# Patient Record
Sex: Female | Born: 1990 | Race: White | Hispanic: No | Marital: Married | Smoking: Former smoker
Health system: Southern US, Community
[De-identification: ages and names within clinical notes are randomized; demographics above are authoritative.]

## PROBLEM LIST (undated history)

## (undated) ENCOUNTER — Inpatient Hospital Stay (HOSPITAL_COMMUNITY): Payer: Self-pay

## (undated) DIAGNOSIS — T8859XA Other complications of anesthesia, initial encounter: Secondary | ICD-10-CM

## (undated) HISTORY — DX: Other complications of anesthesia, initial encounter: T88.59XA

## (undated) HISTORY — PX: WISDOM TOOTH EXTRACTION: SHX21

---
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
## 2010-05-22 ENCOUNTER — Emergency Department (HOSPITAL_COMMUNITY): Admission: EM | Admit: 2010-05-22 | Discharge: 2010-05-23 | Payer: Self-pay | Admitting: Emergency Medicine

## 2011-06-15 ENCOUNTER — Emergency Department (HOSPITAL_COMMUNITY)
Admission: EM | Admit: 2011-06-15 | Discharge: 2011-06-15 | Disposition: A | Discharge status: Home / Self Care | Payer: Managed Care, Other (non HMO) | Attending: Emergency Medicine | Admitting: Emergency Medicine

## 2011-06-15 DIAGNOSIS — R5381 Other malaise: Secondary | ICD-10-CM | POA: Insufficient documentation

## 2011-06-15 DIAGNOSIS — K6289 Other specified diseases of anus and rectum: Secondary | ICD-10-CM | POA: Insufficient documentation

## 2011-06-15 LAB — CBC
HCT: 38.3 % (ref 36.0–46.0)
MCHC: 35.2 g/dL (ref 30.0–36.0)
Platelets: 199 10*3/uL (ref 150–400)
RDW: 12 % (ref 11.5–15.5)

## 2011-06-15 LAB — URINALYSIS, ROUTINE W REFLEX MICROSCOPIC
Nitrite: NEGATIVE
Specific Gravity, Urine: 1.025 (ref 1.005–1.030)
Urobilinogen, UA: 0.2 mg/dL (ref 0.0–1.0)
pH: 7 (ref 5.0–8.0)

## 2011-06-15 LAB — DIFFERENTIAL
Basophils Absolute: 0 10*3/uL (ref 0.0–0.1)
Basophils Relative: 0 % (ref 0–1)
Eosinophils Absolute: 0.3 10*3/uL (ref 0.0–0.7)
Eosinophils Relative: 3 % (ref 0–5)
Lymphocytes Relative: 18 % (ref 12–46)
Monocytes Absolute: 0.8 10*3/uL (ref 0.1–1.0)

## 2011-06-15 LAB — URINE MICROSCOPIC-ADD ON

## 2011-06-15 LAB — BASIC METABOLIC PANEL
BUN: 13 mg/dL (ref 6–23)
GFR calc Af Amer: >60 mL/min (ref >60)
GFR calc non Af Amer: >60 mL/min (ref >60)
Potassium: 4.1 mEq/L (ref 3.5–5.1)

## 2011-06-15 LAB — POCT PREGNANCY, URINE
Preg Test, Ur: NEGATIVE
Preg Test, Ur: NEGATIVE

## 2011-08-08 ENCOUNTER — Inpatient Hospital Stay (INDEPENDENT_AMBULATORY_CARE_PROVIDER_SITE_OTHER)
Admission: RE | Admit: 2011-08-08 | Discharge: 2011-08-08 | Disposition: A | Discharge status: Home / Self Care | Payer: Managed Care, Other (non HMO) | Source: Ambulatory Visit | Attending: Family Medicine | Admitting: Family Medicine

## 2011-08-08 DIAGNOSIS — M799 Soft tissue disorder, unspecified: Secondary | ICD-10-CM

## 2014-05-06 ENCOUNTER — Emergency Department (HOSPITAL_COMMUNITY)
Admission: EM | Admit: 2014-05-06 | Discharge: 2014-05-06 | Disposition: A | Discharge status: Home / Self Care | Payer: Managed Care, Other (non HMO) | Source: Home / Self Care | Attending: Family Medicine | Admitting: Family Medicine

## 2014-05-06 ENCOUNTER — Encounter (HOSPITAL_COMMUNITY): Payer: Self-pay | Admitting: Emergency Medicine

## 2014-05-06 DIAGNOSIS — M792 Neuralgia and neuritis, unspecified: Secondary | ICD-10-CM

## 2014-05-06 DIAGNOSIS — S20229A Contusion of unspecified back wall of thorax, initial encounter: Secondary | ICD-10-CM

## 2014-05-06 DIAGNOSIS — IMO0002 Reserved for concepts with insufficient information to code with codable children: Secondary | ICD-10-CM

## 2014-05-06 MED ORDER — GABAPENTIN 300 MG PO CAPS: 300.0000 mg | ORAL_CAPSULE | Freq: Two times a day (BID) | ORAL | Status: DC
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
## 2014-05-06 NOTE — Discharge Instructions (Signed)
Heat to back and medicine as needed, see your doctor or return if further problems.

## 2014-07-10 ENCOUNTER — Other Ambulatory Visit (HOSPITAL_COMMUNITY): Payer: Self-pay | Admitting: Obstetrics and Gynecology

## 2014-07-10 DIAGNOSIS — N979 Female infertility, unspecified: Secondary | ICD-10-CM

## 2014-08-07 ENCOUNTER — Ambulatory Visit (HOSPITAL_COMMUNITY)
Admission: RE | Admit: 2014-08-07 | Discharge: 2014-08-07 | Disposition: A | Discharge status: Home / Self Care | Payer: Managed Care, Other (non HMO) | Source: Ambulatory Visit | Attending: Obstetrics and Gynecology | Admitting: Obstetrics and Gynecology

## 2014-08-07 DIAGNOSIS — N979 Female infertility, unspecified: Secondary | ICD-10-CM | POA: Insufficient documentation

## 2014-08-07 MED ORDER — IOHEXOL 300 MG/ML  SOLN
20.0000 mL | Freq: Once | INTRAMUSCULAR | Status: AC | PRN
  Administered 2014-08-07: 7 mL

## 2014-12-04 NOTE — L&D Delivery Note (Signed)
Delivery Note At 7:01 PM a viable and healthy female was delivered via Vaginal, Spontaneous Delivery (Presentation: Left Occiput Anterior).  APGAR: 8, 9; weight  pending.   Placenta status: Intact, Spontaneous.  Cord: 3 vessels with the following complications: None  Anesthesia: Epidural  Episiotomy: None Lacerations: None Suture Repair: none Est. Blood Loss (mL): 300  Mom to postpartum.  Baby to Couplet care / Skin to Skin.  Bejamin Hackbart H. 09/04/2015, 7:28 PM

## 2014-12-21 ENCOUNTER — Encounter: Payer: Self-pay | Admitting: "Endocrinology

## 2014-12-21 ENCOUNTER — Ambulatory Visit (INDEPENDENT_AMBULATORY_CARE_PROVIDER_SITE_OTHER): Payer: Managed Care, Other (non HMO) | Admitting: "Endocrinology

## 2014-12-21 VITALS — BP 118/65 | HR 78 | Wt 224.0 lb

## 2014-12-21 DIAGNOSIS — R1013 Epigastric pain: Secondary | ICD-10-CM | POA: Insufficient documentation

## 2014-12-21 DIAGNOSIS — E049 Nontoxic goiter, unspecified: Secondary | ICD-10-CM

## 2014-12-21 DIAGNOSIS — E8881 Metabolic syndrome: Secondary | ICD-10-CM | POA: Insufficient documentation

## 2014-12-21 DIAGNOSIS — E88819 Insulin resistance, unspecified: Secondary | ICD-10-CM | POA: Insufficient documentation

## 2014-12-21 DIAGNOSIS — E161 Other hypoglycemia: Secondary | ICD-10-CM

## 2014-12-21 DIAGNOSIS — L83 Acanthosis nigricans: Secondary | ICD-10-CM

## 2014-12-21 DIAGNOSIS — E669 Obesity, unspecified: Secondary | ICD-10-CM

## 2014-12-21 DIAGNOSIS — N979 Female infertility, unspecified: Secondary | ICD-10-CM

## 2014-12-21 LAB — COMPREHENSIVE METABOLIC PANEL
ALT: 11 U/L (ref 0–35)
AST: 16 U/L (ref 0–37)
Albumin: 4 g/dL (ref 3.5–5.2)
Alkaline Phosphatase: 50 U/L (ref 39–117)
BUN: 9 mg/dL (ref 6–23)
CO2: 24 mEq/L (ref 19–32)
Calcium: 9.3 mg/dL (ref 8.4–10.5)
Chloride: 104 mEq/L (ref 96–112)
Creat: 0.63 mg/dL (ref 0.50–1.10)
Glucose, Bld: 82 mg/dL (ref 70–99)
Potassium: 3.9 mEq/L (ref 3.5–5.3)
Sodium: 136 mEq/L (ref 135–145)
Total Bilirubin: 0.3 mg/dL (ref 0.2–1.2)
Total Protein: 7.2 g/dL (ref 6.0–8.3)

## 2014-12-21 LAB — POCT GLYCOSYLATED HEMOGLOBIN (HGB A1C): Hemoglobin A1C: 5

## 2014-12-21 LAB — T3, FREE: T3 FREE: 3 pg/mL (ref 2.3–4.2)

## 2014-12-21 LAB — GLUCOSE, POCT (MANUAL RESULT ENTRY): POC Glucose: 99 mg/dl (ref 70–99)

## 2014-12-21 LAB — TSH: TSH: 1.256 u[IU]/mL (ref 0.350–4.500)

## 2014-12-21 LAB — T4, FREE: Free T4: 0.98 ng/dL (ref 0.80–1.80)

## 2014-12-21 MED ORDER — RANITIDINE HCL 150 MG PO TABS: 150.0000 mg | ORAL_TABLET | Freq: Two times a day (BID) | ORAL | Status: DC

## 2014-12-21 NOTE — Progress Notes (Signed)
Subjective:  Patient Name: Kathryn Carr Date of Birth: [birth year]  MRN: [id number]  Angila Wombles  presents to the office today after self-referral for initial endocrine consultation for the chief complaints of her obesity, infertility, and possible PCOS.  HISTORY OF PRESENT ILLNESS:   Rendi is a 24 y.o. Caucasian young woman.  Haruye was unaccompanied.  1. Present illness:  A. Chief complaint: Obesity   1). She has always been overweight, "chunky".   2). In her freshman year in high school she got tired of being bullied for being fat. She lost 50-60 pounds, from about 200 to 145. She exercised more, cut out sodas, and ate healthier. She was also bulimic for awhile. She kept the weight off for about 2 years. The she met her future husband,, ate out a lot with him, and both became obese.    3). Although she has tried to lose weight in the recent past, her husband wants her to eat more when he does. He says that she looks fine just as she is.    4). She has an Implanon implant from 2010-2012. She had the implant removed on November 2012. Despite having normal periods, and normal ovulation by blood tests and US imaging, the couple has been infertile.    5). Husband is 5-11 and weighs about 265 pounds. His energy level is much lower now than it was several years ago. He has high estradiol and low testosterone. He has some excess breast tissue. His sperm count is low and the sperm motility is low. The couple has intercourse every 1-7 days, usually about 2 times per week. Her libido usually exceeds his libido. She excuses his lack of libido as due to him working 6 days per week. He now takes letrozole.    6). She has been evaluated for infertility at a center in [place]. She was told that her female hormone levels were elevated at one time, but later told that the levels were normal. She is under treatment now. She took letrozole for 5 days from 12/03/14 to 12/07/14. She then had a trigger shot of Ovidrel  (?)(HCG?) on 12/15/14 to stimulate ovulation. She had intrauterine insemination with her husband's sperm on 12/17/14.   B. Pertinent past medical history:   1). Medical problems: Obesity   2). Surgeries: Wisdom teeth removed,    3). Allergies: Allergy to penicillin. Oxycodone causes nausea and vomiting. No allergies to environmental agents.   4). GYN: As above   5). Medications: None, except as above  C. Pertinent family history   1). Obesity: Mother, sister, 2 brothers, maternal grandparents, maternal uncle   2). Irregular periods/infertility: None   3). DM: Maternal grandfather had T2DM. Mother had GDM. Maternal uncle has T2DM.   4). Thyroid disease: Mom had Hodgkins Lymphoma involving her neck, so had XRT, leading to hypothyroidism. Paternal grandmother may have had thyroid problems.    5). ASCVD: Dad and paternal grandfather had MIs. PGF has also had several strokes.    6). Cancers: Mom had HL.    7). Others: Lots of arthritis, including both parents, MGF, MGM.  D. Lifestyle:   1). Diet: She loves carbs and salty things like pickles. Dinner is her big meal of the day. She drinks lots of sodas. She is not very interested, if interested at all, in changing her diet.     2). Physical activity: None. She is not interested in exercise.     2. Pertinent Review of Systems:  Constitutional: The patient  feels "fine". She has no significant complaints. Eyes: Vision is good with her glasses. One eye is "lazier" than the other. There are no other significant eye complaints. Neck: The patient has no complaints of anterior neck swelling, soreness, tenderness,  pressure, discomfort, or difficulty swallowing.  Heart: Heart rate increases with exercise or other physical activity. Heart rate also increases when she is anxious. The patient has no complaints of palpitations, irregular heat beats, chest pain, or chest pressure. Gastrointestinal: She has lots of belly hunger. "I'm always hungry.". Bowel movents  seem normal. The patient has no complaints of excessive hunger, acid reflux, upset stomach, stomach aches or pains, diarrhea, or constipation. Legs: Muscle mass and strength seem normal. There are no complaints of numbness, tingling, burning, or pain. No edema is noted. Feet: There are no obvious foot problems. There are no complaints of numbness, tingling, burning, or pain. No edema is noted. Her feet sometimes hurt when she stands and walks all day.  GYN: As above   PAST MEDICAL, FAMILY, AND SOCIAL HISTORY:  No past medical history on file.  No family history on file.   Current outpatient prescriptions:  .  gabapentin (NEURONTIN) 300 MG capsule, Take 1 capsule (300 mg total) by mouth 2 (two) times daily. (Patient not taking: Reported on 12/21/2014), Disp: 30 capsule, Rfl: 0  Allergies as of 12/21/2014 - Review Complete 12/21/2014  Allergen Reaction Noted  . Penicillins  05/06/2014    1. 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 2. Activities: Sedentary 3. Smoking, alcohol, or drugs: She smokes occasionally.  4. Primary Care Provider: None  REVIEW OF SYSTEMS: There are no other significant problems involving Shelsey's other body systems.   Objective:  Vital Signs:  BP 118/65 mmHg  Pulse 78  Wt 224 lb (101.606 kg)   Ht Readings from Last 3 Encounters:  No data found for Ht   Wt Readings from Last 3 Encounters:  12/21/14 224 lb (101.606 kg)   HC Readings from Last 3 Encounters:  No data found for [facility]   There is no height on file to calculate BSA.  Normalized stature-for-age data available only for age 9 to 7 years. Normalized weight-for-age data available only for age 9 to 20 years. Height is about 62 inches. She is 114 pounds above her Ideal Body Weight.  PHYSICAL EXAM:  Constitutional: The patient appears healthy and well nourished. She is alert and bright. She is not sure if she wants to make the effort to lose weight.  Face: The face  appears normal. She has some plethora, c/w her degree of obesity. There is no sexual hair on her face or sideburns area. Eyes: There is no obvious arcus or proptosis. Moisture appears normal. No hirsutism Mouth: The oropharynx and tongue appear normal. Oral moisture is normal. The oral mucosa is not hyperpigmented. Neck: The neck appears to be visibly normal. No carotid bruits are noted. The thyroid gland is slightly enlarged at 20-21 grams in size. The right lobe is normal in size. The left lobe is mildly enlarged. The consistency of the thyroid gland is normal. The thyroid gland is not tender to palpation. She has 1+ acanthosis. Chest: No sexual hair Lungs: The lungs are clear to auscultation. Air movement is good. Heart: Heart rate and rhythm are regular. Heart sounds S1 and S2 are normal. I did not appreciate any pathologic cardiac murmurs. Abdomen: The abdomen appears to be normal in size. Bowel sounds are normal.  There is no obvious hepatomegaly, splenomegaly, or other mass effect. There is no sexual hair.  Arms: Muscle size and bulk are normal for age. Hands: There is no obvious tremor. Phalangeal and metacarpophalangeal joints are normal. Palmar muscles are normal. Palmar skin is normal. Palmar moisture is also normal. There is not any palmar hyperpigmentation. Legs: Muscles appear normal for age. No edema is present. Neurologic: Strength is normal for age in both the upper and lower extremities. Muscle tone is normal. Sensation to touch is normal in both legs.   Skin: She has multiple pale striae of her abdominal skin.    LAB DATA:  Results for orders placed or performed in visit on 12/21/14 (from the past 504 hour(s))  POCT Glucose (CBG)   Collection Time: 12/21/14 11:03 AM  Result Value Ref Range   POC Glucose 99 70 - 99 mg/dl   Labs 12/21/14: HbA1c in our clinic today was 5.0%, which is mid-range normal for her age. TSH is 1.256, free T4 .98, free T3 3. TPO antibody level, CMP, and  C-peptide are pending.    Assessment and Plan:   ASSESSMENT:  1. Obesity: She is quite obese. Her overly fat adipose cells are producing cytokines that cause resistance to insulin. Her young beta cells produce excessive amounts of insulin resulting in hyperinsulinemia. The hyperinsulinemia, in turn, causes acanthosis and excess gastric acid production and increased belly hunger (dyspepsia).  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 Acanthosis: As above 5. Dyspepsia: As above 6. Goiter: She may have evolving Hashimoto's thyroiditis.She thinks that she has had thyroid tests done as part of her evaluation for infertility, but does not know the results.  7. Infertility: Based upon the information I have today, it sounds as if the major issue causing infertility is her husband's hypogonadism, low sperm count, and sperm immotility. I need to evaluate her records from [place] to determine if she has more evidence of infertility problems herself.  8. Possible PCOS:   A. By history she has had normal ovulatory periods. However, the use of letrozole and a triggering agent to induce ovulation indicates that she may have had some anovulatory periods. She does not have any clinical signs of hyperandrogenism. I do not know if she has actually had elevations of testosterone or adrenal androgens.   B. According to the [place] Criteria, to make the diagnosis of PCOS, one must have two of the three following characteristics:    1). PCO morphology on [place]   2). Clinical or chemical evidence of hyperandrogenism   3). Irregular periods, oligomenorrhea, or infertility.  C. According to the NIH Criteria used in the [place] to make the diagnosis of PCOS one must have both evidence of hyperandrogenism and irregular periods/infertility.  D. Until I can review the clinical reports and data from [place] I can't make any determination as to whether or not she has PCOS.   E. Without further data it is also  impossible to determine if she has The Metabolic Syndrome. While she has the central obesity, her BP and HbA1c are normal. We also need to determine her lipid levels.  PLAN: 1. Diagnostic: Obtain TFTs, TPO antibody, CMP, and C-peptide today. Await other lab results from [place].  2. Therapeutic: Ranitidine, 15 mg, twice daily to reduce dyspepsia. 3. Patient education: We discussed all of the above at great length. 4. Follow-up: 3 months. We will contact her with lab results.  Level of Service: This visit lasted in excess of 40  minutes. More than 50% of the visit was devoted to counseling.  Sherrlyn Hock, MD, CDE Adult and Pediatric Endocrinology

## 2014-12-21 NOTE — Patient Instructions (Signed)
Follow up visit in 3 months. Start ranitidine, 15 mg, twice daily at breakfast and dinner

## 2014-12-22 LAB — C-PEPTIDE: C-Peptide: 2.72 ng/mL (ref 0.80–3.90)

## 2014-12-22 LAB — THYROID PEROXIDASE ANTIBODY: Thyroperoxidase Ab SerPl-aCnc: 1 IU/mL (ref <9)

## 2014-12-23 ENCOUNTER — Telehealth: Payer: Self-pay | Admitting: "Endocrinology

## 2014-12-23 ENCOUNTER — Other Ambulatory Visit: Payer: Self-pay | Admitting: *Deleted

## 2014-12-23 DIAGNOSIS — R1013 Epigastric pain: Secondary | ICD-10-CM

## 2014-12-23 MED ORDER — RANITIDINE HCL 150 MG PO TABS: 150.0000 mg | ORAL_TABLET | Freq: Two times a day (BID) | ORAL | Status: DC

## 2014-12-23 NOTE — Telephone Encounter (Signed)
Script sent. KW

## 2015-01-19 ENCOUNTER — Encounter: Payer: Self-pay | Admitting: *Deleted

## 2015-01-27 LAB — US OB COMP LESS 14 WKS

## 2015-03-01 ENCOUNTER — Telehealth: Payer: Self-pay | Admitting: "Endocrinology

## 2015-03-01 NOTE — Telephone Encounter (Signed)
Forwarded to Dr. Brennan.  

## 2015-03-02 ENCOUNTER — Telehealth: Payer: Self-pay | Admitting: "Endocrinology

## 2015-03-02 NOTE — Telephone Encounter (Signed)
1.patient called and left message that she is pregnant and wants to discuss her thyroid tests.  2. When I tried to return her call, the mobile phone number we have was inactive.  3. I will ask our nurses to try to track her down tomorrow.  David StallBRENNAN,MICHAEL J

## 2015-03-04 ENCOUNTER — Other Ambulatory Visit (HOSPITAL_COMMUNITY): Payer: Self-pay | Admitting: Nurse Practitioner

## 2015-03-04 DIAGNOSIS — Z3682 Encounter for antenatal screening for nuchal translucency: Secondary | ICD-10-CM

## 2015-03-09 ENCOUNTER — Ambulatory Visit (HOSPITAL_COMMUNITY)
Admission: RE | Admit: 2015-03-09 | Discharge: 2015-03-09 | Disposition: A | Discharge status: Home / Self Care | Payer: Managed Care, Other (non HMO) | Source: Ambulatory Visit | Attending: Nurse Practitioner | Admitting: Nurse Practitioner

## 2015-03-09 ENCOUNTER — Encounter (HOSPITAL_COMMUNITY): Payer: Self-pay

## 2015-03-09 DIAGNOSIS — O99211 Obesity complicating pregnancy, first trimester: Secondary | ICD-10-CM | POA: Diagnosis not present

## 2015-03-09 DIAGNOSIS — Z36 Encounter for antenatal screening of mother: Secondary | ICD-10-CM | POA: Diagnosis not present

## 2015-03-09 DIAGNOSIS — Z3A13 13 weeks gestation of pregnancy: Secondary | ICD-10-CM | POA: Diagnosis not present

## 2015-03-09 DIAGNOSIS — Z3682 Encounter for antenatal screening for nuchal translucency: Secondary | ICD-10-CM | POA: Insufficient documentation

## 2015-03-16 ENCOUNTER — Other Ambulatory Visit (HOSPITAL_COMMUNITY): Payer: Self-pay | Admitting: "Endocrinology

## 2015-03-21 ENCOUNTER — Encounter (HOSPITAL_COMMUNITY): Payer: Self-pay | Admitting: Emergency Medicine

## 2015-03-21 ENCOUNTER — Emergency Department (HOSPITAL_COMMUNITY)
Admission: EM | Admit: 2015-03-21 | Discharge: 2015-03-22 | Disposition: A | Discharge status: Home / Self Care | Payer: Managed Care, Other (non HMO) | Attending: Emergency Medicine | Admitting: Emergency Medicine

## 2015-03-21 DIAGNOSIS — Z880 Allergy status to penicillin: Secondary | ICD-10-CM | POA: Insufficient documentation

## 2015-03-21 DIAGNOSIS — Z87891 Personal history of nicotine dependence: Secondary | ICD-10-CM | POA: Insufficient documentation

## 2015-03-21 DIAGNOSIS — X58XXXA Exposure to other specified factors, initial encounter: Secondary | ICD-10-CM | POA: Diagnosis not present

## 2015-03-21 DIAGNOSIS — Z3A15 15 weeks gestation of pregnancy: Secondary | ICD-10-CM | POA: Diagnosis not present

## 2015-03-21 DIAGNOSIS — O9A212 Injury, poisoning and certain other consequences of external causes complicating pregnancy, second trimester: Secondary | ICD-10-CM | POA: Insufficient documentation

## 2015-03-21 DIAGNOSIS — O9989 Other specified diseases and conditions complicating pregnancy, childbirth and the puerperium: Secondary | ICD-10-CM | POA: Diagnosis not present

## 2015-03-21 DIAGNOSIS — N898 Other specified noninflammatory disorders of vagina: Secondary | ICD-10-CM | POA: Insufficient documentation

## 2015-03-21 DIAGNOSIS — Y999 Unspecified external cause status: Secondary | ICD-10-CM | POA: Insufficient documentation

## 2015-03-21 DIAGNOSIS — S239XXA Sprain of unspecified parts of thorax, initial encounter: Secondary | ICD-10-CM

## 2015-03-21 DIAGNOSIS — R109 Unspecified abdominal pain: Secondary | ICD-10-CM | POA: Diagnosis not present

## 2015-03-21 DIAGNOSIS — R0789 Other chest pain: Secondary | ICD-10-CM | POA: Insufficient documentation

## 2015-03-21 DIAGNOSIS — Y929 Unspecified place or not applicable: Secondary | ICD-10-CM | POA: Insufficient documentation

## 2015-03-21 DIAGNOSIS — R0781 Pleurodynia: Secondary | ICD-10-CM

## 2015-03-21 DIAGNOSIS — Y939 Activity, unspecified: Secondary | ICD-10-CM | POA: Insufficient documentation

## 2015-03-21 LAB — URINALYSIS, ROUTINE W REFLEX MICROSCOPIC
BILIRUBIN URINE: NEGATIVE
Glucose, UA: NEGATIVE mg/dL
Hgb urine dipstick: NEGATIVE
Ketones, ur: NEGATIVE mg/dL
LEUKOCYTES UA: NEGATIVE
NITRITE: NEGATIVE
PH: 6.5 (ref 5.0–8.0)
Protein, ur: NEGATIVE mg/dL
Specific Gravity, Urine: 1.026 (ref 1.005–1.030)
Urobilinogen, UA: 0.2 mg/dL (ref 0.0–1.0)

## 2015-03-21 LAB — CBC WITH DIFFERENTIAL/PLATELET
BASOS PCT: 0 % (ref 0–1)
Basophils Absolute: 0 10*3/uL (ref 0.0–0.1)
EOS ABS: 0.2 10*3/uL (ref 0.0–0.7)
Eosinophils Relative: 2 % (ref 0–5)
HEMATOCRIT: 38.6 % (ref 36.0–46.0)
HEMOGLOBIN: 13 g/dL (ref 12.0–15.0)
Lymphocytes Relative: 20 % (ref 12–46)
Lymphs Abs: 2.1 10*3/uL (ref 0.7–4.0)
MCH: 29.3 pg (ref 26.0–34.0)
MCHC: 33.7 g/dL (ref 30.0–36.0)
MCV: 86.9 fL (ref 78.0–100.0)
MONOS PCT: 5 % (ref 3–12)
Monocytes Absolute: 0.5 10*3/uL (ref 0.1–1.0)
NEUTROS ABS: 7.4 10*3/uL (ref 1.7–7.7)
NEUTROS PCT: 73 % (ref 43–77)
Platelets: 214 10*3/uL (ref 150–400)
RBC: 4.44 MIL/uL (ref 3.87–5.11)
RDW: 13.3 % (ref 11.5–15.5)
WBC: 10.2 10*3/uL (ref 4.0–10.5)

## 2015-03-21 LAB — WET PREP, GENITAL
Trich, Wet Prep: NONE SEEN
WBC WET PREP: NONE SEEN
YEAST WET PREP: NONE SEEN

## 2015-03-21 NOTE — ED Notes (Signed)
C/o constant sharp pain to L side since 6pm.  Pt states she is [redacted] weeks pregnant.  Denies any other symptoms.

## 2015-03-21 NOTE — ED Provider Notes (Signed)
CSN: [id number]     Arrival date & time 03/21/15  2257 History   First MD Initiated Contact with Patient 03/21/15 2312     Chief Complaint  Patient presents with  . L side pain     (Consider location/radiation/quality/duration/timing/severity/associated sxs/prior Treatment) HPI Comments: Patient is a 24 year old female, currently [redacted] weeks pregnant, who presents to the emergency department for further evaluation of left flank pain. Patient states that pain has been sharp and constant since 1800 today. She states that she took to Tylenol without relief. She denies experiencing similar pain in the past. Patient is nonradiating and without modifying factors. She reports feeling the pain a bit more with deep breathing. She denies associated fever, leg swelling, hemoptysis, shortness of breath, syncope, new nausea or vomiting (she has had some mild N/V with pregnancy), vaginal bleeding, vaginal discharge, dysuria, or hematuria. No hx of abdominal surgeries. She reports a past U/S confirming intrauterine pregnancy.  The history is provided by the patient. No language interpreter was used.    History reviewed. No pertinent past medical history. Past Surgical History  Procedure Laterality Date  . Wisdom tooth extraction     No family history on file. History  Substance Use Topics  . Smoking status: Former Smoker    Quit date: 03/20/2014  . Smokeless tobacco: Never Used  . Alcohol Use: No   OB History    Gravida Para Term Preterm AB TAB SAB Ectopic Multiple Living   1               Review of Systems  Constitutional: Negative for fever.  Respiratory: Negative for shortness of breath.   Cardiovascular: Negative for chest pain and leg swelling.  Gastrointestinal: Negative for nausea, vomiting and abdominal pain.  Genitourinary: Positive for flank pain. Negative for vaginal bleeding and vaginal discharge.  Neurological: Negative for syncope.  All other systems reviewed and are  negative.   Allergies  Penicillins  Home Medications   Prior to Admission medications   Medication Sig Start Date End Date Taking? Authorizing Provider  Prenatal Vit-Fe Fumarate-FA (PRENATAL MULTIVITAMIN) TABS tablet Take 1 tablet by mouth daily at 12 noon.   Yes Historical Provider, MD  gabapentin (NEURONTIN) 300 MG capsule Take 1 capsule (300 mg total) by mouth 2 (two) times daily. Patient not taking: Reported on 12/21/2014 05/06/14   Linna Hoff, MD  ranitidine (ZANTAC) 150 MG tablet Take 1 tablet (150 mg total) by mouth 2 (two) times daily. Patient not taking: Reported on 03/21/2015 12/23/14   David Stall, MD   BP 113/67 mmHg  Pulse 87  Temp(Src) 98.3 F (36.8 C) (Oral)  Resp 16  Ht  (1.575 m)  Wt 237 lb (107.502 kg)  BMI 43.34 kg/m2  SpO2 98%  LMP 12/03/2014   Physical Exam  Constitutional: She is oriented to person, place, and time. She appears well-developed and well-nourished. No distress.  Nontoxic/nonseptic appearing. Patient pleasant.  HENT:  Head: Normocephalic and atraumatic.  Mouth/Throat: Oropharynx is clear and moist.  Eyes: Conjunctivae and EOM are normal. No scleral icterus.  Neck: Normal range of motion.  Cardiovascular: Normal rate, regular rhythm and intact distal pulses.   Pulmonary/Chest: Effort normal and breath sounds normal. No respiratory distress. She has no wheezes. She has no rales.  Chest expansion symmetric. Lungs clear. No tachypnea or dyspnea.  Abdominal: Soft. There is tenderness. There is no rebound and no guarding.  Abdomen soft with mild tenderness to the left mid abdomen. Patient also  with referred tenderness to the suprapubic region when palpating the left mid abdomen. No masses or peritoneal signs appreciated.  Genitourinary: There is no rash, tenderness, lesion or injury on the right labia. There is no rash, tenderness, lesion or injury on the left labia. Uterus is not tender. Cervix exhibits no motion tenderness and no  friability. Right adnexum displays no mass, no tenderness and no fullness. Left adnexum displays tenderness (mild). Left adnexum displays no mass and no fullness. No bleeding in the vagina. Vaginal discharge (clear/white discharge) found.  Musculoskeletal: Normal range of motion. She exhibits tenderness.  Mild left thoracic paraspinal muscle tenderness. No tenderness to the thoracic midline.  Neurological: She is alert and oriented to person, place, and time. She exhibits normal muscle tone. Coordination normal.  GCS 15. Speech is goal oriented. Patient moves extremities without ataxia.  Skin: Skin is warm and dry. No rash noted. She is not diaphoretic. No erythema. No pallor.  Psychiatric: She has a normal mood and affect. Her behavior is normal.  Nursing note and vitals reviewed.   ED Course  Procedures (including critical care time) Labs Review Labs Reviewed  WET PREP, GENITAL - Abnormal; Notable for the following:    Clue Cells Wet Prep HPF POC FEW (*)    All other components within normal limits  COMPREHENSIVE METABOLIC PANEL - Abnormal; Notable for the following:    Glucose, Bld 102 (*)    Albumin 3.2 (*)    All other components within normal limits  URINALYSIS, ROUTINE W REFLEX MICROSCOPIC - Abnormal; Notable for the following:    APPearance CLOUDY (*)    All other components within normal limits  HCG, QUANTITATIVE, PREGNANCY - Abnormal; Notable for the following:    hCG, Beta Chain, Quant, [place] [phone] (*)    All other components within normal limits  D-DIMER, QUANTITATIVE - Abnormal; Notable for the following:    D-Dimer, Quant 0.52 (*)    All other components within normal limits  CBC WITH DIFFERENTIAL/PLATELET  LIPASE, BLOOD  GC/CHLAMYDIA PROBE AMP ([place])    Imaging Review Ct Angio Chest Pe W/cm &/or Wo Cm  03/22/2015   CLINICAL DATA:  Pleuritic chest pain. Elevated D-dimer. Fifteen weeks pregnant.  EXAM: CT ANGIOGRAPHY CHEST WITH CONTRAST  TECHNIQUE: Multidetector  CT imaging of the chest was performed using the standard protocol during bolus administration of intravenous contrast. Multiplanar CT image reconstructions and MIPs were obtained to evaluate the vascular anatomy.  CONTRAST:  75mL OMNIPAQUE IOHEXOL 350 MG/ML SOLN  COMPARISON:  None.  FINDINGS: Pulmonary arterial opacification is adequate without evidence of emboli allowing for mild motion artifact. No enlarged axillary, mediastinal, or hilar lymph nodes are identified. A small amount of homogeneous, triangular-shaped soft tissue in the anterior mediastinum may represent a small amount of residual thymus. Heart is normal in size. There is no pleural or pericardial effusion. No lung consolidation or nodules are identified.  Visualized portion of the upper abdomen is unremarkable. Mild endplate irregularity of several adjacent lower thoracic vertebral bodies with associated, at most minimal anterior vertebral body wedging is chronic in appearance.  Review of the MIP images confirms the above findings.  IMPRESSION: No evidence of pulmonary emboli.   Electronically Signed   By: Sebastian AcheAllen  Grady   On: 03/22/2015 02:53     EKG Interpretation None      MDM   Final diagnoses:  Chest pain, pleuritic  Thoracic back sprain, initial encounter    24 year old female presents to the emergency department for  further evaluation of pain to her left mid back. She is [redacted] weeks pregnant with confirmed intrauterine pregnancy. Patient is afebrile and hemodynamically stable. Her laboratory workup is reassuring. Urinalysis does not suggest infection. Patient complaining of pain which is worse with inspiration. Given her pregnancy, pulmonary embolus considered. EKG is nonischemic with no signs of heart strain. Her d-dimer was 0.52. I discussed with the patient the risks and benefits of CT scan today. Patient chose to proceed with CT angio.  CT scan reviewed which shows no evidence of pulmonary embolus. Given reproducibility of  pain, suspect that symptoms are musculoskeletal in origin. Will manage as outpatient with Tylenol as well as ice and heat. Patient advised to follow-up with her OB/GYN and primary doctor for further evaluation of symptoms. Return precautions discussed and provided. Patient agreeable to plan with no unaddressed concerns. Patient discharged in good condition; VSS.   Filed Vitals:   03/22/15 0215 03/22/15 0230 03/22/15 0245 03/22/15 0300  BP: 123/59 121/59 121/57 113/67  Pulse: 75 72 76 87  Temp:      TempSrc:      Resp: Height:      Weight:      SpO2: 99% 99% 98% 98%     Antony Madura, PA-C 03/22/15 4098  Gilda Crease, MD 03/22/15 [phone]

## 2015-03-22 ENCOUNTER — Encounter (HOSPITAL_COMMUNITY): Payer: Self-pay | Admitting: *Deleted

## 2015-03-22 ENCOUNTER — Emergency Department (HOSPITAL_COMMUNITY): Payer: Managed Care, Other (non HMO)

## 2015-03-22 ENCOUNTER — Encounter (HOSPITAL_COMMUNITY): Payer: Self-pay | Admitting: Radiology

## 2015-03-22 ENCOUNTER — Inpatient Hospital Stay (HOSPITAL_COMMUNITY)
Admission: AD | Admit: 2015-03-22 | Discharge: 2015-03-22 | Disposition: A | Discharge status: Home / Self Care | Payer: Managed Care, Other (non HMO) | Source: Ambulatory Visit | Attending: Obstetrics and Gynecology | Admitting: Obstetrics and Gynecology

## 2015-03-22 DIAGNOSIS — Z3A15 15 weeks gestation of pregnancy: Secondary | ICD-10-CM | POA: Diagnosis not present

## 2015-03-22 DIAGNOSIS — O9989 Other specified diseases and conditions complicating pregnancy, childbirth and the puerperium: Secondary | ICD-10-CM | POA: Diagnosis not present

## 2015-03-22 DIAGNOSIS — Z87891 Personal history of nicotine dependence: Secondary | ICD-10-CM | POA: Insufficient documentation

## 2015-03-22 DIAGNOSIS — O9A212 Injury, poisoning and certain other consequences of external causes complicating pregnancy, second trimester: Secondary | ICD-10-CM | POA: Diagnosis not present

## 2015-03-22 DIAGNOSIS — R109 Unspecified abdominal pain: Secondary | ICD-10-CM | POA: Diagnosis not present

## 2015-03-22 DIAGNOSIS — R1032 Left lower quadrant pain: Secondary | ICD-10-CM | POA: Insufficient documentation

## 2015-03-22 LAB — COMPREHENSIVE METABOLIC PANEL
ALT: 18 U/L (ref 0–35)
AST: 20 U/L (ref 0–37)
Albumin: 3.2 g/dL — ABNORMAL LOW (ref 3.5–5.2)
Alkaline Phosphatase: 45 U/L (ref 39–117)
Anion gap: 11 (ref 5–15)
BILIRUBIN TOTAL: 0.4 mg/dL (ref 0.3–1.2)
BUN: 6 mg/dL (ref 6–23)
CHLORIDE: 104 mmol/L (ref 96–112)
CO2: 21 mmol/L (ref 19–32)
CREATININE: 0.72 mg/dL (ref 0.50–1.10)
Calcium: 8.9 mg/dL (ref 8.4–10.5)
GFR calc Af Amer: >90 mL/min (ref >90)
Glucose, Bld: 102 mg/dL — ABNORMAL HIGH (ref 70–99)
Potassium: 4 mmol/L (ref 3.5–5.1)
SODIUM: 136 mmol/L (ref 135–145)
Total Protein: 6.6 g/dL (ref 6.0–8.3)

## 2015-03-22 LAB — URINALYSIS, ROUTINE W REFLEX MICROSCOPIC
BILIRUBIN URINE: NEGATIVE
Glucose, UA: NEGATIVE mg/dL
Hgb urine dipstick: NEGATIVE
KETONES UR: NEGATIVE mg/dL
Leukocytes, UA: NEGATIVE
NITRITE: NEGATIVE
PH: 6 (ref 5.0–8.0)
PROTEIN: NEGATIVE mg/dL
Specific Gravity, Urine: 1.025 (ref 1.005–1.030)
Urobilinogen, UA: 0.2 mg/dL (ref 0.0–1.0)

## 2015-03-22 LAB — LIPASE, BLOOD: LIPASE: 29 U/L (ref 11–59)

## 2015-03-22 LAB — D-DIMER, QUANTITATIVE (NOT AT ARMC): D DIMER QUANT: 0.52 ug{FEU}/mL — AB (ref 0.00–0.48)

## 2015-03-22 LAB — GC/CHLAMYDIA PROBE AMP (~~LOC~~) NOT AT ARMC
Chlamydia: NEGATIVE
Neisseria Gonorrhea: NEGATIVE

## 2015-03-22 LAB — HCG, QUANTITATIVE, PREGNANCY: HCG, BETA CHAIN, QUANT, S: 41685 m[IU]/mL — AB (ref <5)

## 2015-03-22 MED ORDER — IOHEXOL 350 MG/ML SOLN
75.0000 mL | Freq: Once | INTRAVENOUS | Status: AC | PRN
Start: 2015-03-22 — End: 2015-03-22
  Administered 2015-03-22: 75 mL via INTRAVENOUS

## 2015-03-22 NOTE — MAU Note (Signed)
Seen @ [facility] last night for side pain, had CT scan, now has LLQ pain since she got home this morning.  Denies bleeding.  Concerned because she started having sx's after CT scan.

## 2015-03-22 NOTE — Discharge Instructions (Signed)
Recommend Tylenol as needed as well as alternating of ice and heat to area of pain. Follow-up with your OB/GYN for further evaluation of symptoms as needed. Return to the emergency department as needed if symptoms worsen.   Muscle Strain A muscle strain is an injury that occurs when a muscle is stretched beyond its normal length. Usually a small number of muscle fibers are torn when this happens. Muscle strain is rated in degrees. First-degree strains have the least amount of muscle fiber tearing and pain. Second-degree and third-degree strains have increasingly more tearing and pain.  Usually, recovery from muscle strain takes 1-2 weeks. Complete healing takes 5-6 weeks.  CAUSES  Muscle strain happens when a sudden, violent force placed on a muscle stretches it too far. This may occur with lifting, sports, or a fall.  RISK FACTORS Muscle strain is especially common in athletes.  SIGNS AND SYMPTOMS At the site of the muscle strain, there may be:  Pain.  Bruising.  Swelling.  Difficulty using the muscle due to pain or lack of normal function. DIAGNOSIS  Your health care provider will perform a physical exam and ask about your medical history. TREATMENT  Often, the best treatment for a muscle strain is resting, icing, and applying cold compresses to the injured area.  HOME CARE INSTRUCTIONS   Use the PRICE method of treatment to promote muscle healing during the first 2-3 days after your injury. The PRICE method involves:  Protecting the muscle from being injured again.  Restricting your activity and resting the injured body part.  Icing your injury. To do this, put ice in a plastic bag. Place a towel between your skin and the bag. Then, apply the ice and leave it on from 15-20 minutes each hour. After the third day, switch to moist heat packs.  Apply compression to the injured area with a splint or elastic bandage. Be careful not to wrap it too tightly. This may interfere with blood  circulation or increase swelling.  Elevate the injured body part above the level of your heart as often as you can.  Only take over-the-counter or prescription medicines for pain, discomfort, or fever as directed by your health care provider.  Warming up prior to exercise helps to prevent future muscle strains. SEEK MEDICAL CARE IF:   You have increasing pain or swelling in the injured area.  You have numbness, tingling, or a significant loss of strength in the injured area. MAKE SURE YOU:   Understand these instructions.  Will watch your condition.  Will get help right away if you are not doing well or get worse. Document Released: 11/20/2005 Document Revised: 09/10/2013 Document Reviewed: 06/19/2013 [facility] Patient Information 2015 [place]. This information is not intended to replace advice given to you by your health care provider. Make sure you discuss any questions you have with your health care provider.

## 2015-03-22 NOTE — ED Notes (Signed)
Contacted CT

## 2015-03-22 NOTE — ED Notes (Signed)
Pt stable, ambulatory, pain decreased to 1/10, pt states understanding of discharge instructions

## 2015-03-22 NOTE — MAU Provider Note (Signed)
  History   G1 at 15.4 wks in with c/o lower left quad pain x several days. Pain is sharp and intermittant in nature. Denies bleeding or ROM  CSN: [id number]  Arrival date and time: 03/22/15 1248   First Provider Initiated Contact with Patient 03/22/15 1433      Chief Complaint  Patient presents with  . Abdominal Pain   HPI  OB History    Gravida Para Term Preterm AB TAB SAB Ectopic Multiple Living   1               History reviewed. No pertinent past medical history.  Past Surgical History  Procedure Laterality Date  . Wisdom tooth extraction      History reviewed. No pertinent family history.  History  Substance Use Topics  . Smoking status: Former Smoker    Quit date: 03/20/2014  . Smokeless tobacco: Never Used  . Alcohol Use: No    Allergies:  Allergies  Allergen Reactions  . Penicillins Hives    Prescriptions prior to admission  Medication Sig Dispense Refill Last Dose  . gabapentin (NEURONTIN) 300 MG capsule Take 1 capsule (300 mg total) by mouth 2 (two) times daily. (Patient not taking: Reported on 12/21/2014) 30 capsule 0 Not Taking  . Prenatal Vit-Fe Fumarate-FA (PRENATAL MULTIVITAMIN) TABS tablet Take 1 tablet by mouth daily at 12 noon.   03/20/2015 at Unknown time  . ranitidine (ZANTAC) 150 MG tablet Take 1 tablet (150 mg total) by mouth 2 (two) times daily. (Patient not taking: Reported on 03/21/2015) 60 tablet 6     Review of Systems  Constitutional: Negative.   HENT: Negative.   Eyes: Negative.   Respiratory: Negative.   Cardiovascular: Negative.   Gastrointestinal: Positive for abdominal pain.  Genitourinary: Negative.   Musculoskeletal: Negative.   Skin: Negative.   Neurological: Negative.   Endo/Heme/Allergies: Negative.   Psychiatric/Behavioral: Negative.    Physical Exam   Blood pressure 124/63, pulse 78, temperature 98.2 F (36.8 C), temperature source Oral, resp. rate 20, last menstrual period 12/03/2014.  Physical Exam   Constitutional: She is oriented to person, place, and time. She appears well-developed and well-nourished.  HENT:  Head: Normocephalic.  Eyes: Pupils are equal, round, and reactive to light.  Neck: Normal range of motion.  Cardiovascular: Normal rate, regular rhythm, normal heart sounds and intact distal pulses.   Respiratory: Effort normal and breath sounds normal.  GI: Soft. Bowel sounds are normal.  Genitourinary: Vagina normal and uterus normal.  Musculoskeletal: Normal range of motion.  Neurological: She is alert and oriented to person, place, and time. She has normal reflexes.  Skin: Skin is warm and dry.  Psychiatric: She has a normal mood and affect. Her behavior is normal. Judgment and thought content normal.    MAU Course  Procedures  MDM Normal discomforts of pregnancy.   Assessment and Plan  SVE firm/cl/post/high. FHR 138 st and regular. Normal exam. D/c home.  Wyvonnia DuskyLAWSON, Lavoris Canizales DARLENE 03/22/2015, 2:44 PM

## 2015-03-24 ENCOUNTER — Ambulatory Visit: Payer: Managed Care, Other (non HMO) | Admitting: "Endocrinology

## 2015-03-24 ENCOUNTER — Other Ambulatory Visit: Payer: Self-pay

## 2015-03-25 LAB — CYTOLOGY - PAP

## 2015-04-03 LAB — OB RESULTS CONSOLE ABO/RH

## 2015-04-06 NOTE — Telephone Encounter (Signed)
Please call patient and discuss concerns. Please fax lab work to phone number below.

## 2015-04-07 NOTE — Telephone Encounter (Signed)
Handled by Fransico MichaelBrennan on 3/29

## 2015-05-27 ENCOUNTER — Other Ambulatory Visit (HOSPITAL_COMMUNITY): Payer: Self-pay | Admitting: Obstetrics and Gynecology

## 2015-05-27 DIAGNOSIS — O283 Abnormal ultrasonic finding on antenatal screening of mother: Secondary | ICD-10-CM

## 2015-06-01 ENCOUNTER — Ambulatory Visit: Payer: Managed Care, Other (non HMO) | Admitting: "Endocrinology

## 2015-06-08 ENCOUNTER — Ambulatory Visit (HOSPITAL_COMMUNITY): Admission: RE | Admit: 2015-06-08 | Payer: Managed Care, Other (non HMO) | Source: Ambulatory Visit

## 2015-06-08 ENCOUNTER — Encounter (HOSPITAL_COMMUNITY): Payer: Self-pay

## 2015-06-08 ENCOUNTER — Ambulatory Visit (HOSPITAL_COMMUNITY)
Admission: RE | Admit: 2015-06-08 | Discharge: 2015-06-08 | Disposition: A | Discharge status: Home / Self Care | Payer: Managed Care, Other (non HMO) | Source: Ambulatory Visit | Attending: Obstetrics and Gynecology | Admitting: Obstetrics and Gynecology

## 2015-06-08 DIAGNOSIS — O358XX0 Maternal care for other (suspected) fetal abnormality and damage, not applicable or unspecified: Secondary | ICD-10-CM | POA: Insufficient documentation

## 2015-06-08 DIAGNOSIS — O99212 Obesity complicating pregnancy, second trimester: Secondary | ICD-10-CM | POA: Insufficient documentation

## 2015-06-08 DIAGNOSIS — O283 Abnormal ultrasonic finding on antenatal screening of mother: Secondary | ICD-10-CM | POA: Diagnosis not present

## 2015-06-08 DIAGNOSIS — O09812 Supervision of pregnancy resulting from assisted reproductive technology, second trimester: Secondary | ICD-10-CM | POA: Insufficient documentation

## 2015-06-08 DIAGNOSIS — Z36 Encounter for antenatal screening of mother: Secondary | ICD-10-CM | POA: Insufficient documentation

## 2015-06-08 DIAGNOSIS — Z3A26 26 weeks gestation of pregnancy: Secondary | ICD-10-CM | POA: Diagnosis not present

## 2015-06-08 DIAGNOSIS — Z3689 Encounter for other specified antenatal screening: Secondary | ICD-10-CM | POA: Insufficient documentation

## 2015-06-10 ENCOUNTER — Ambulatory Visit (HOSPITAL_COMMUNITY): Admission: RE | Admit: 2015-06-10 | Payer: Managed Care, Other (non HMO) | Source: Ambulatory Visit

## 2015-06-10 ENCOUNTER — Encounter (HOSPITAL_COMMUNITY): Payer: Managed Care, Other (non HMO)

## 2015-06-11 ENCOUNTER — Encounter (HOSPITAL_COMMUNITY): Payer: Self-pay | Admitting: Obstetrics and Gynecology

## 2015-06-11 ENCOUNTER — Other Ambulatory Visit (HOSPITAL_COMMUNITY): Payer: Self-pay | Admitting: Obstetrics and Gynecology

## 2015-06-15 IMAGING — US US MFM FETAL NUCHAL TRANSLUCENCY
1 series · 13 of 28 positions shown · non-contrast
Comparison: none

[Series 1: us mfm fetal nuchal translucency · 0.12mm/px · 13 of 46 slices shown]
[im 2/46]
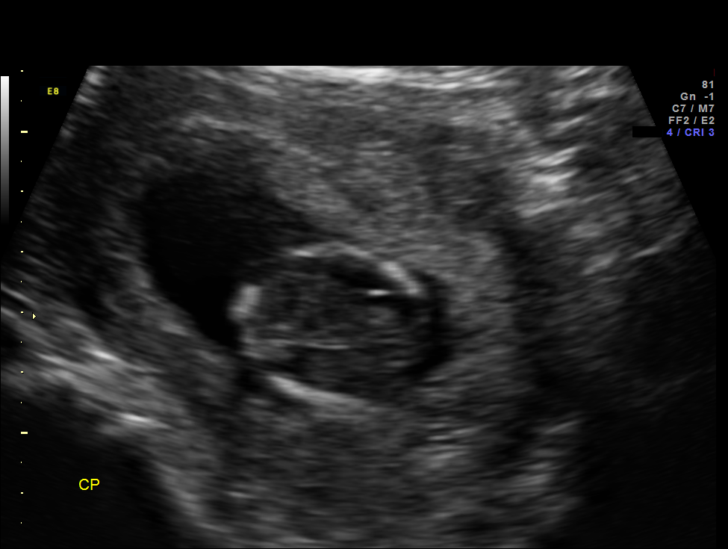
[im 6/46]
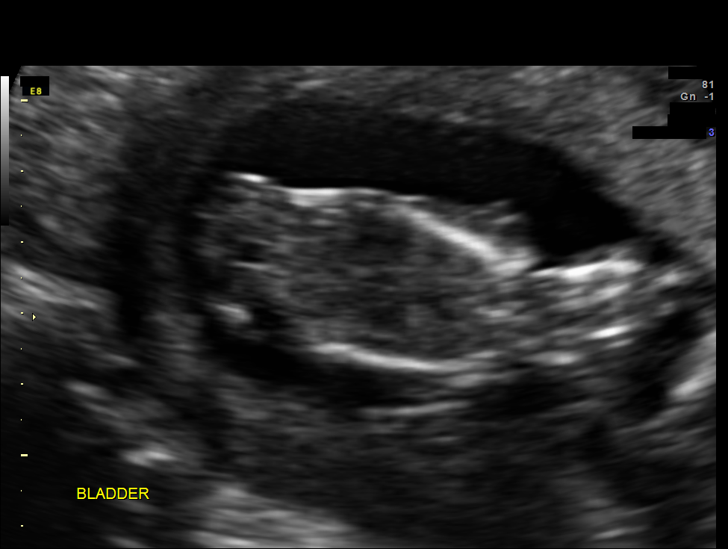
[im 9/46]
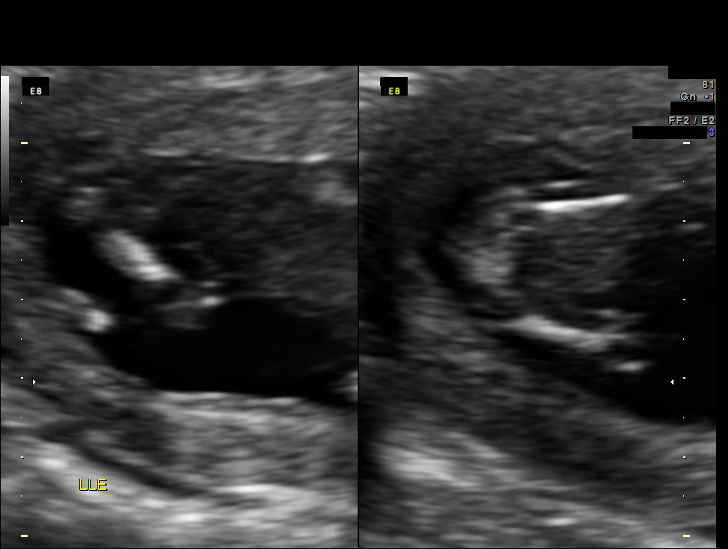
[im 12/46]
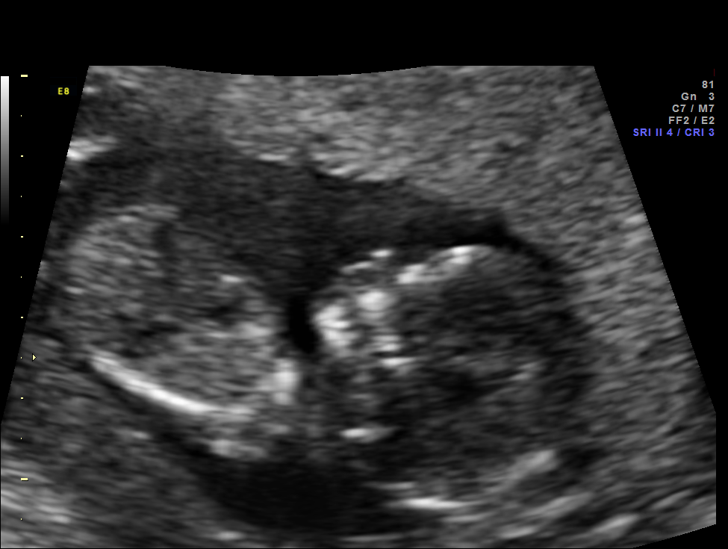
[im 16/46]
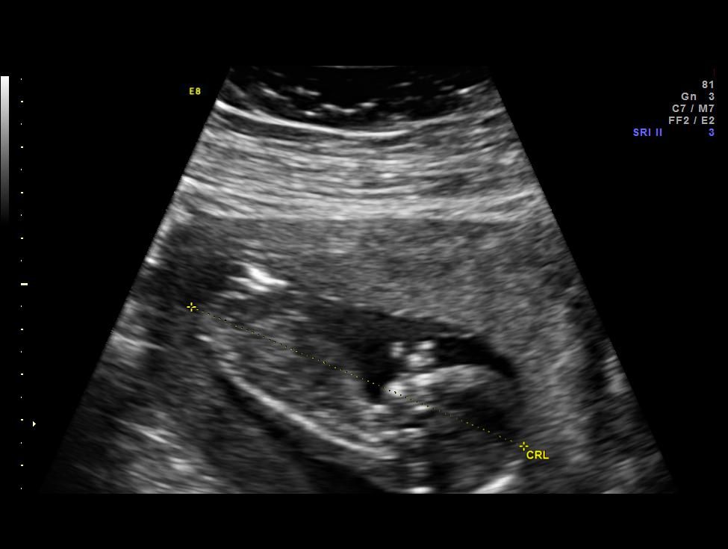
[im 19/46]
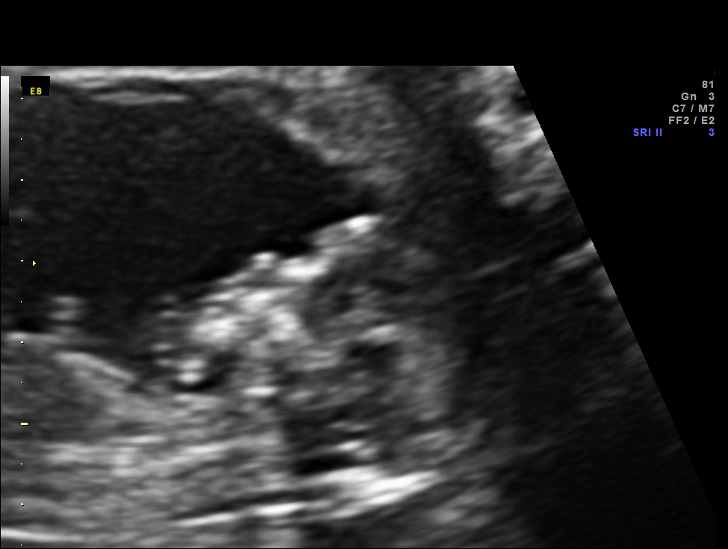
[im 24/46]
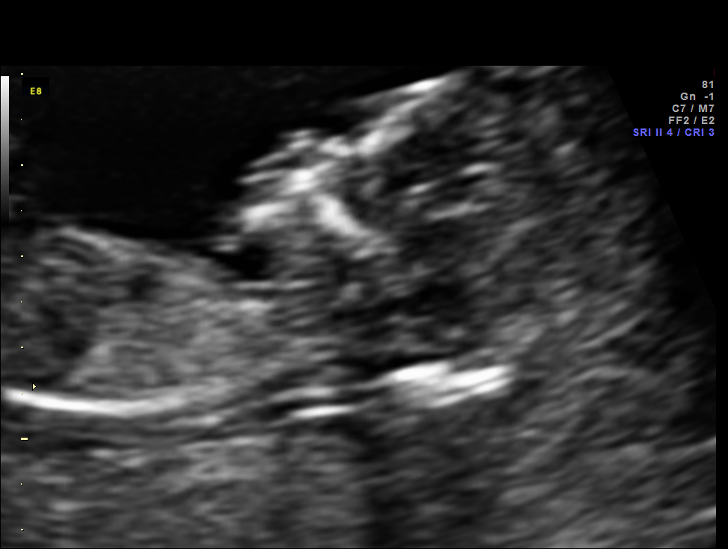
[im 27/46]
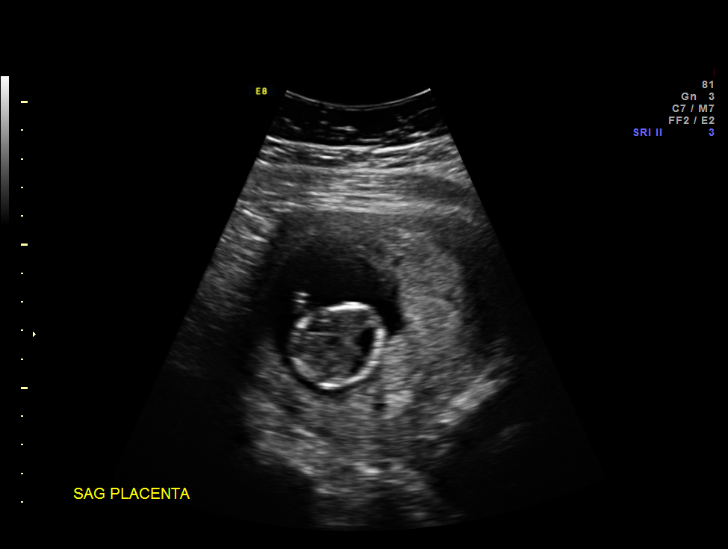
[im 31/46]
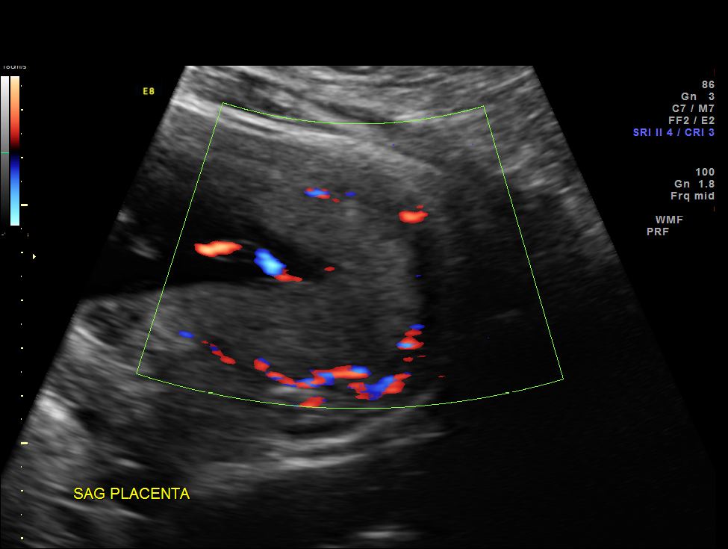
[im 34/46]
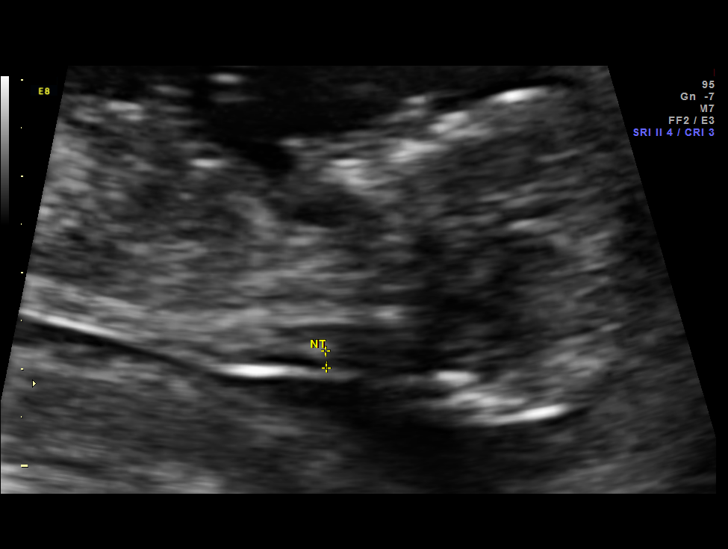
[im 37/46]
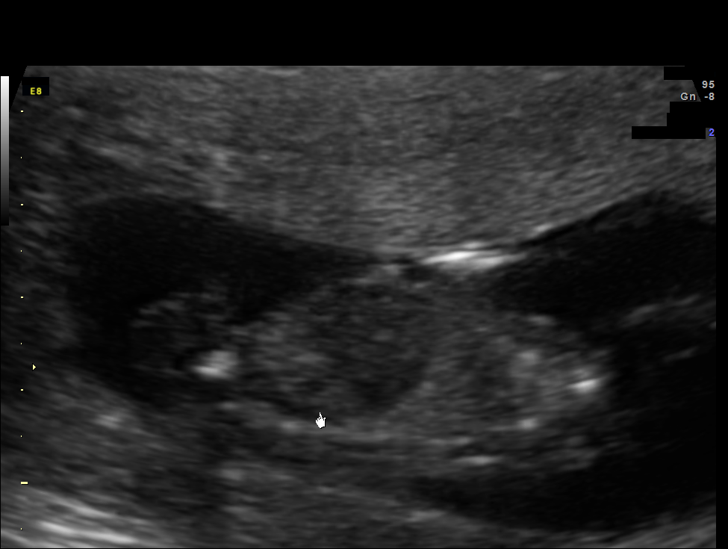
[im 41/46]
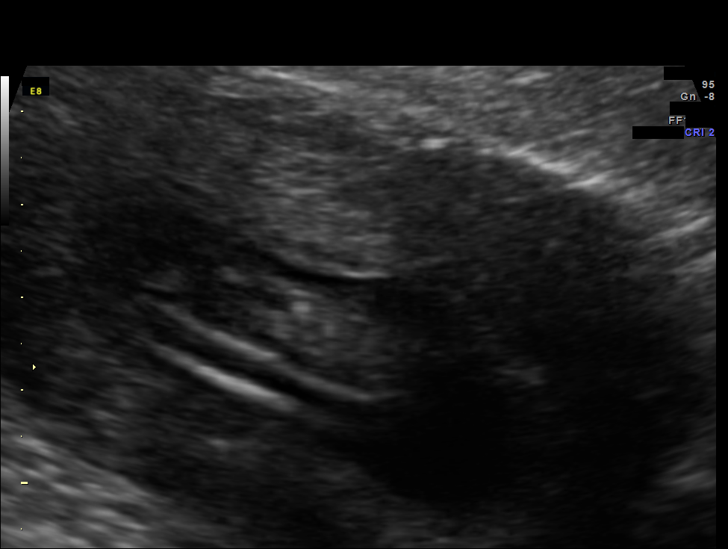
[im 44/46]
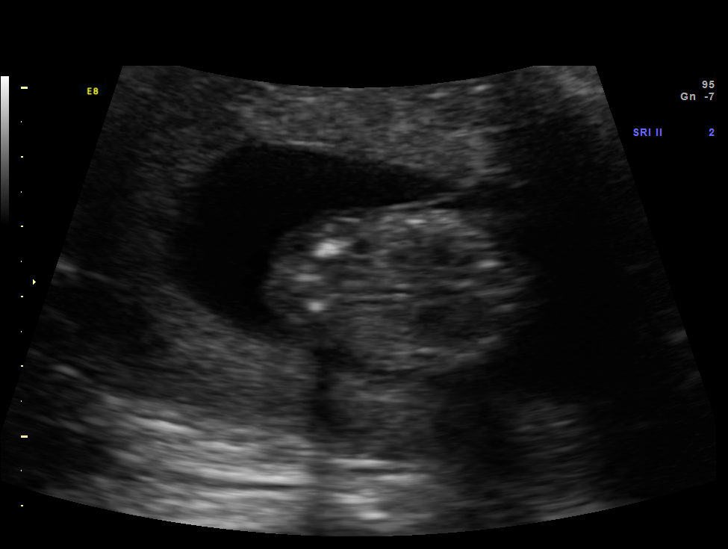

[13 of 28 positions shown; findings below may reference images not displayed]

OBSTETRICS REPORT

Service(s) Provided

Indications

 First trimester aneuploidy screen (NT)                Z36
 13 weeks gestation of pregnancy
 Obesity complicating pregnancy, first trimester
Fetal Evaluation

 Num Of Fetuses:    1
 Fetal Heart Rate:  145                          bpm
 Cardiac Activity:  Observed
 Presentation:      Transverse, head to
                    maternal left
 Placenta:          Posterior

 Amniotic Fluid
 AFI FV:      Subjectively within normal limits
Gestational Age

 LMP:           13w 5d        Date:  12/03/14                 EDD:   09/09/15
 Best:          13w 5d     Det. By:  LMP  (12/03/14)          EDD:   09/09/15
1st Trimester Genetic Sonogram Screening

 CRL:            79.6  mm    G. Age:   13w 5d                 EDD:   09/09/15
 Nuc Trans:       1.8  mm

 Nasal Bone:                 Present
Anatomy

 Choroid Plexus:   Appears normal         Bladder:          Appears normal
 Stomach:          Appears normal, left   Lower             Visualized
                   sided                  Extremities:
 Cord Vessels:     Appears normal (3      Upper             Visualized
                   vessel cord)           Extremities:
Impression

 SIUP at 13+5 weeks
 No gross abnormalities identified
 NT measurement was within normal limits for this GA; NB
 present
 Normal amniotic fluid volume
 Measurements consistent with LMP dating
Recommendations

 Offer MSAFP in the second trimester for ONTD screening
 Offer anatomy U/S by 18 weeks

 questions or concerns.

## 2015-07-19 ENCOUNTER — Inpatient Hospital Stay (HOSPITAL_COMMUNITY)
Admission: AD | Admit: 2015-07-19 | Discharge: 2015-07-20 | Disposition: A | Discharge status: Home / Self Care | Payer: Managed Care, Other (non HMO) | Source: Ambulatory Visit | Attending: Obstetrics and Gynecology | Admitting: Obstetrics and Gynecology

## 2015-07-19 ENCOUNTER — Encounter (HOSPITAL_COMMUNITY): Payer: Self-pay

## 2015-07-19 DIAGNOSIS — R1011 Right upper quadrant pain: Secondary | ICD-10-CM | POA: Diagnosis present

## 2015-07-19 DIAGNOSIS — O99013 Anemia complicating pregnancy, third trimester: Secondary | ICD-10-CM | POA: Diagnosis not present

## 2015-07-19 DIAGNOSIS — Z3A32 32 weeks gestation of pregnancy: Secondary | ICD-10-CM | POA: Insufficient documentation

## 2015-07-19 DIAGNOSIS — Z880 Allergy status to penicillin: Secondary | ICD-10-CM | POA: Diagnosis not present

## 2015-07-19 DIAGNOSIS — Z87891 Personal history of nicotine dependence: Secondary | ICD-10-CM | POA: Diagnosis not present

## 2015-07-19 DIAGNOSIS — O26893 Other specified pregnancy related conditions, third trimester: Secondary | ICD-10-CM

## 2015-07-19 DIAGNOSIS — R0781 Pleurodynia: Secondary | ICD-10-CM | POA: Insufficient documentation

## 2015-07-19 DIAGNOSIS — O9989 Other specified diseases and conditions complicating pregnancy, childbirth and the puerperium: Secondary | ICD-10-CM | POA: Insufficient documentation

## 2015-07-19 DIAGNOSIS — R42 Dizziness and giddiness: Secondary | ICD-10-CM | POA: Diagnosis not present

## 2015-07-19 DIAGNOSIS — R519 Headache, unspecified: Secondary | ICD-10-CM

## 2015-07-19 DIAGNOSIS — R51 Headache: Secondary | ICD-10-CM | POA: Diagnosis not present

## 2015-07-19 LAB — URINE MICROSCOPIC-ADD ON

## 2015-07-19 LAB — PROTEIN / CREATININE RATIO, URINE
CREATININE, URINE: 60 mg/dL
Total Protein, Urine: <6 mg/dL

## 2015-07-19 LAB — URINALYSIS, ROUTINE W REFLEX MICROSCOPIC
BILIRUBIN URINE: NEGATIVE
Glucose, UA: NEGATIVE mg/dL
KETONES UR: NEGATIVE mg/dL
Leukocytes, UA: NEGATIVE
Nitrite: NEGATIVE
Protein, ur: NEGATIVE mg/dL
UROBILINOGEN UA: 0.2 mg/dL (ref 0.0–1.0)
pH: 5.5 (ref 5.0–8.0)

## 2015-07-19 LAB — CBC
HEMATOCRIT: 32.7 % — AB (ref 36.0–46.0)
Hemoglobin: 10.9 g/dL — ABNORMAL LOW (ref 12.0–15.0)
MCH: 28.7 pg (ref 26.0–34.0)
MCHC: 33.3 g/dL (ref 30.0–36.0)
MCV: 86.1 fL (ref 78.0–100.0)
Platelets: 159 10*3/uL (ref 150–400)
RBC: 3.8 MIL/uL — ABNORMAL LOW (ref 3.87–5.11)
RDW: 14.4 % (ref 11.5–15.5)
WBC: 11.8 10*3/uL — ABNORMAL HIGH (ref 4.0–10.5)

## 2015-07-19 LAB — COMPREHENSIVE METABOLIC PANEL
ALT: 12 U/L — AB (ref 14–54)
AST: 17 U/L (ref 15–41)
Albumin: 2.8 g/dL — ABNORMAL LOW (ref 3.5–5.0)
Alkaline Phosphatase: 67 U/L (ref 38–126)
Anion gap: 10 (ref 5–15)
BILIRUBIN TOTAL: 0.4 mg/dL (ref 0.3–1.2)
BUN: 8 mg/dL (ref 6–20)
CO2: 22 mmol/L (ref 22–32)
CREATININE: 0.47 mg/dL (ref 0.44–1.00)
Calcium: 8.8 mg/dL — ABNORMAL LOW (ref 8.9–10.3)
Chloride: 105 mmol/L (ref 101–111)
GFR calc Af Amer: >60 mL/min (ref >60)
Glucose, Bld: 86 mg/dL (ref 65–99)
POTASSIUM: 3.5 mmol/L (ref 3.5–5.1)
Sodium: 137 mmol/L (ref 135–145)
TOTAL PROTEIN: 6.9 g/dL (ref 6.5–8.1)

## 2015-07-19 LAB — AMYLASE: Amylase: 54 U/L (ref 28–100)

## 2015-07-19 LAB — LIPASE, BLOOD: Lipase: 20 U/L — ABNORMAL LOW (ref 22–51)

## 2015-07-19 MED ORDER — BUTALBITAL-APAP-CAFFEINE 50-325-40 MG PO TABS
2.0000 | ORAL_TABLET | Freq: Once | ORAL | Status: AC
  Administered 2015-07-19: 2 via ORAL
  Filled 2015-07-19: qty 2

## 2015-07-19 NOTE — MAU Provider Note (Signed)
Chief Complaint:  No chief complaint on file.   First Provider Initiated Contact with Patient 07/19/15 2243      HPI: Kathryn Carr is a 24 y.o. G1P0 at [redacted]w[redacted]d who presents to maternity admissions reporting headache and dizziness since 5:00pm and RUQ pain x 1 week. No Hx HTN. Has abd simlar HA's in past, but took Tylenol w/out relief.   Location: Generalized HA Quality: Throbbing Severity: 6/10 in pain scale Duration:5 hours Context: Random Timing: Constant Modifying factors: No improvement w/ Tylenol Associated signs and symptoms: Neg for fever, chills, vision changes, N/V/D/C  Denies contractions, leakage of fluid or vaginal bleeding. Good fetal movement.   Past Medical History: History reviewed. No pertinent past medical history.  Past obstetric history: OB History  Gravida Para Term Preterm AB SAB TAB Ectopic Multiple Living  1             # Outcome Date GA Lbr Len/2nd Weight Sex Delivery Anes PTL Lv  1 Current               Past Surgical History: Past Surgical History  Procedure Laterality Date  . Wisdom tooth extraction       Family History: History reviewed. No pertinent family history.  Social History: Social History  Substance Use Topics  . Smoking status: Former Smoker    Quit date: 03/20/2014  . Smokeless tobacco: Never Used  . Alcohol Use: No    Allergies:  Allergies  Allergen Reactions  . Penicillins Hives    Meds:  Prescriptions prior to admission  Medication Sig Dispense Refill Last Dose  . acetaminophen (TYLENOL) 325 MG tablet Take 650 mg by mouth every 6 (six) hours as needed.   07/19/2015 at 1900  . calcium carbonate (TUMS - DOSED IN MG ELEMENTAL CALCIUM) 500 MG chewable tablet Chew 2 tablets by mouth daily as needed for heartburn.   Past Week at Unknown time  . Prenatal Vit-Fe Fumarate-FA (PRENATAL MULTIVITAMIN) TABS tablet Take 1 tablet by mouth daily at 12 noon.   Past Week at Unknown time  . ranitidine (ZANTAC) 150 MG tablet Take 1  tablet (150 mg total) by mouth 2 (two) times daily. (Patient not taking: Reported on 03/21/2015) 60 tablet 6 Not Taking at Unknown time  . [DISCONTINUED] gabapentin (NEURONTIN) 300 MG capsule Take 1 capsule (300 mg total) by mouth 2 (two) times daily. (Patient not taking: Reported on 12/21/2014) 30 capsule 0 Not Taking at Unknown time    I have reviewed patient's Past Medical Hx, Surgical Hx, Family Hx, Social Hx, medications and allergies.   ROS:  Review of Systems  Constitutional: Negative for fever and chills.  HENT: Negative for congestion, ear pain, facial swelling and sinus pressure.   Eyes: Negative for visual disturbance.  Cardiovascular: Negative for chest pain.  Gastrointestinal: Negative for nausea, vomiting, diarrhea and constipation. Abdominal pain: RUQ.  Genitourinary: Negative for flank pain.  Neurological: Positive for dizziness and headaches. Negative for syncope, speech difficulty, weakness, light-headedness and numbness.    Physical Exam   Patient Vitals for the past 24 hrs:  BP Temp Temp src Pulse Resp SpO2 Height Weight  07/20/15 0019 112/70 mmHg 98.4 F (36.9 C) Oral 93 16 - - -  07/19/15 2308 117/68 mmHg - - 92 - - - -  07/19/15 2306 121/78 mmHg - - 84 - - - -  07/19/15 2305 - - - 83 - - - -  07/19/15 2304 122/58 mmHg - - 79 - - - -  07/19/15 2300 (!) 128/51 mmHg - - 86 - 98 % - -  07/19/15 2245 122/70 mmHg - - 81 - 96 % - -  07/19/15 2230 127/62 mmHg - - 86 - 96 % - -  07/19/15 2215 127/66 mmHg - - 76 - 100 % - -  07/19/15 2210 - - - 89 - - - -  07/19/15 2200 131/89 mmHg - - 94 - 97 % - -  07/19/15 2150 - - - 83 - - - -  07/19/15 2145 124/67 mmHg - - 90 - 98 % - -  07/19/15 2140 - - - 85 - - - -  07/19/15 2130 125/75 mmHg - - 84 - 98 % - -  07/19/15 2120 141/67 mmHg - - 87 - - - -  07/19/15 2101 129/69 mmHg - Oral 93 18 100 % 5\' 2"  (1.575 m) 263 lb (119.296 kg)   Constitutional: Well-developed, well-nourished female in no acute distress.  Cardiovascular:  normal rate and rhythm Respiratory: normal effort MS: right low ribs TTP. No crepitus.  GI: Abd soft, non-tender, gravid appropriate for gestational age. Pos BS x 4 MS: Extremities nontender, no edema, normal ROM Neurologic: Alert and oriented x 4.  GU: Neg CVAT.   FHT:  Baseline 130 , moderate variability, accelerations present, no decelerations Contractions: none   Labs: Results for orders placed or performed during the hospital encounter of 07/19/15 (from the past 24 hour(s))  Urinalysis, Routine w reflex microscopic (not at [facility])     Status: Abnormal   Collection Time: 07/19/15  9:03 PM  Result Value Ref Range   Color, Urine YELLOW YELLOW   APPearance CLEAR CLEAR   Specific Gravity, Urine >1.030 (H) 1.005 - 1.030   pH 5.5 5.0 - 8.0   Glucose, UA NEGATIVE NEGATIVE mg/dL   Hgb urine dipstick TRACE (A) NEGATIVE   Bilirubin Urine NEGATIVE NEGATIVE   Ketones, ur NEGATIVE NEGATIVE mg/dL   Protein, ur NEGATIVE NEGATIVE mg/dL   Urobilinogen, UA 0.2 0.0 - 1.0 mg/dL   Nitrite NEGATIVE NEGATIVE   Leukocytes, UA NEGATIVE NEGATIVE  Urine microscopic-add on     Status: Abnormal   Collection Time: 07/19/15  9:03 PM  Result Value Ref Range   Squamous Epithelial / LPF RARE RARE   WBC, UA 0-2 <3 WBC/hpf   Bacteria, UA FEW (A) RARE   Urine-Other MUCOUS PRESENT   CBC     Status: Abnormal   Collection Time: 07/19/15  9:50 PM  Result Value Ref Range   WBC 11.8 (H) 4.0 - 10.5 K/uL   RBC 3.80 (L) 3.87 - 5.11 MIL/uL   Hemoglobin 10.9 (L) 12.0 - 15.0 g/dL   HCT 40.9 (L) 81.1 - 91.4 %   MCV 86.1 78.0 - 100.0 fL   MCH 28.7 26.0 - 34.0 pg   MCHC 33.3 30.0 - 36.0 g/dL   RDW 78.2 95.6 - 21.3 %   Platelets 159 150 - 400 K/uL  Comprehensive metabolic panel     Status: Abnormal   Collection Time: 07/19/15  9:50 PM  Result Value Ref Range   Sodium 137 135 - 145 mmol/L   Potassium 3.5 3.5 - 5.1 mmol/L   Chloride 105 101 - 111 mmol/L   CO2 22 22 - 32 mmol/L   Glucose, Bld 86 65 - 99 mg/dL   BUN  8 6 - 20 mg/dL   Creatinine, Ser 0.86 0.44 - 1.00 mg/dL   Calcium 8.8 (L) 8.9 - 10.3 mg/dL  Total Protein 6.9 6.5 - 8.1 g/dL   Albumin 2.8 (L) 3.5 - 5.0 g/dL   AST 17 15 - 41 U/L   ALT 12 (L) 14 - 54 U/L   Alkaline Phosphatase 67 38 - 126 U/L   Total Bilirubin 0.4 0.3 - 1.2 mg/dL   GFR calc non Af Amer >60 >60 mL/min   GFR calc Af Amer >60 >60 mL/min   Anion gap 10 5 - 15  Amylase     Status: None   Collection Time: 07/19/15  9:50 PM  Result Value Ref Range   Amylase 54 28 - 100 U/L  Lipase, blood     Status: Abnormal   Collection Time: 07/19/15  9:50 PM  Result Value Ref Range   Lipase 20 (L) 22 - 51 U/L  Protein / creatinine ratio, urine     Status: None   Collection Time: 07/19/15 10:20 PM  Result Value Ref Range   Creatinine, Urine 60.00 mg/dL   Total Protein, Urine <6.00 mg/dL   Protein Creatinine Ratio        0.00 - 0.15 mg/mg[Cre]  Too low to calculate  Imaging:  No results found.  MAU Course: CBC, CMET, UA, P:C ratio, Amylase, Lipase, Fioricet, GI cocktail, push fluids.   HA, dizziness resolved. Dr. Claiborne Billings notified of HA, RUQ pain, neg Pre-E labs, VS, response to Tx. Agrees w/ POC.   MDM: 24 year-old pregnant female w/ HA RUQ pain, dizziness evaluated for Pre-E (neg). RUQ pain C/W MS rib pain. HA not concerning for meningitis or other emergent condition.   Assessment: 1. Headache in pregnancy, antepartum, third trimester   2. Rib pain on right side   3. Dizziness   4. Anemia affecting pregnancy in third trimester, antepartum     Plan: Discharge home in stable condition per.  Preter, labor precautions and fetal kick counts HA red flags and Pre-E precautions reviewed.  Comfort measures, apply Ice for rib pain  Rx Fioricet.     Follow-up Information    Follow up with CALLAHAN, SIDNEY, DO.   Specialty:  Obstetrics and Gynecology   Why:  Routine prenatal visit   Contact information:   [address] [place]  [address] [phone]       Follow up with [facility] [place] [facility].   Why:  As needed in emergencies   Contact information:   [address] [id number] mc [place] [address] [phone]        Medication List    TAKE these medications        acetaminophen 325 MG tablet  Commonly known as:  TYLENOL  Take 650 mg by mouth every 6 (six) hours as needed.     butalbital-acetaminophen-caffeine 50-325-40 MG per tablet  Commonly known as:  FIORICET  Take 1-2 tablets by mouth every 6 (six) hours as needed for headache.     calcium carbonate 500 MG chewable tablet  Commonly known as:  TUMS - dosed in mg elemental calcium  Chew 2 tablets by mouth daily as needed for heartburn.     INTEGRA F 125-1 MG Caps  Take 1 capsule by mouth daily.     prenatal multivitamin Tabs tablet  Take 1 tablet by mouth daily at 12 noon.     ranitidine 150 MG tablet  Commonly known as:  ZANTAC  Take 1 tablet (150 mg total) by mouth 2 (two) times daily.        [place]  Katrinka Blazing, CNM 07/20/2015 12:50 AM

## 2015-07-19 NOTE — MAU Note (Signed)
Pt reports a headache and dizziness since 5 pm. Took tylenol with out relief. Denies problems with pregnancy. Also reports some pain in right upper quadrant for the last week.

## 2015-07-20 DIAGNOSIS — O26893 Other specified pregnancy related conditions, third trimester: Secondary | ICD-10-CM | POA: Diagnosis not present

## 2015-07-20 DIAGNOSIS — R51 Headache: Secondary | ICD-10-CM

## 2015-07-20 DIAGNOSIS — O9989 Other specified diseases and conditions complicating pregnancy, childbirth and the puerperium: Secondary | ICD-10-CM | POA: Diagnosis not present

## 2015-07-20 MED ORDER — BUTALBITAL-APAP-CAFFEINE 50-325-40 MG PO TABS: 1.0000 | ORAL_TABLET | Freq: Four times a day (QID) | ORAL | Status: DC | PRN

## 2015-07-20 MED ORDER — GI COCKTAIL ~~LOC~~
30.0000 mL | Freq: Once | ORAL | Status: AC
  Administered 2015-07-20: 30 mL via ORAL
  Filled 2015-07-20: qty 30

## 2015-07-20 MED ORDER — INTEGRA F 125-1 MG PO CAPS: 1.0000 | ORAL_CAPSULE | Freq: Every day | ORAL | Status: DC

## 2015-07-20 NOTE — Discharge Instructions (Signed)
Your hemoglobin level is 10.9. This means that you're mildly anemic. Start taking one iron tablet daily and increasing iron rich foods in your diet (meat, eggs, dark green leafy vegetables, beans, cooking and cast iron)  General Headache Without Cause A headache is pain or discomfort felt around the head or neck area. The specific cause of a headache may not be found. There are many causes and types of headaches. A few common ones are:  Tension headaches.  Migraine headaches.  Cluster headaches.  Chronic daily headaches. HOME CARE INSTRUCTIONS   Keep all follow-up appointments with your caregiver or any specialist referral.  Only take over-the-counter or prescription medicines for pain or discomfort as directed by your caregiver.  Lie down in a dark, quiet room when you have a headache.  Keep a headache journal to find out what may trigger your migraine headaches. For example, write down:  What you eat and drink.  How much sleep you get.  Any change to your diet or medicines.  Try massage or other relaxation techniques.  Put ice packs or heat on the head and neck. Use these 3 to 4 times per day for 15 to 20 minutes each time, or as needed.  Limit stress.  Sit up straight, and do not tense your muscles.  Quit smoking if you smoke.  Limit alcohol use.  Decrease the amount of caffeine you drink, or stop drinking caffeine.  Eat and sleep on a regular schedule.  Get 7 to 9 hours of sleep, or as recommended by your caregiver.  Keep lights dim if bright lights bother you and make your headaches worse. SEEK MEDICAL CARE IF:   You have problems with the medicines you were prescribed.  Your medicines are not working.  You have a change from the usual headache.  You have nausea or vomiting. SEEK IMMEDIATE MEDICAL CARE IF:   Your headache becomes severe.  You have a fever.  You have a stiff neck.  You have loss of vision.  You have muscular weakness or loss of  muscle control.  You start losing your balance or have trouble walking.  You feel faint or pass out.  You have severe symptoms that are different from your first symptoms. MAKE SURE YOU:   Understand these instructions.  Will watch your condition.  Will get help right away if you are not doing well or get worse. Document Released: 11/20/2005 Document Revised: 02/12/2012 Document Reviewed: 12/06/2011 [facility] Patient Information 2015 [place], [place]. This information is not intended to replace advice given to you by your health care provider. Make sure you discuss any questions you have with your health care provider.  Dizziness Dizziness is a common problem. It is a feeling of unsteadiness or light-headedness. You may feel like you are about to faint. Dizziness can lead to injury if you stumble or fall. A person of any age group can suffer from dizziness, but dizziness is more common in older adults. CAUSES  Dizziness can be caused by many different things, including:  Middle ear problems.  Standing for too long.  Infections.  An allergic reaction.  Aging.  An emotional response to something, such as the sight of blood.  Side effects of medicines.  Tiredness.  Problems with circulation or blood pressure.  Excessive use of alcohol or medicines, or illegal drug use.  Breathing too fast (hyperventilation).  An irregular heart rhythm (arrhythmia).  A low red blood cell count (anemia).  Pregnancy.  Vomiting, diarrhea, fever, or other  illnesses that cause body fluid loss (dehydration).  Diseases or conditions such as Parkinson's disease, high blood pressure (hypertension), diabetes, and thyroid problems.  Exposure to extreme heat. DIAGNOSIS  Your health care provider will ask about your symptoms, perform a physical exam, and perform an electrocardiogram (ECG) to record the electrical activity of your heart. Your health care provider may also perform other heart or  blood tests to determine the cause of your dizziness. These may include:  Transthoracic echocardiogram (TTE). During echocardiography, sound waves are used to evaluate how blood flows through your heart.  Transesophageal echocardiogram (TEE).  Cardiac monitoring. This allows your health care provider to monitor your heart rate and rhythm in real time.  Holter monitor. This is a portable device that records your heartbeat and can help diagnose heart arrhythmias. It allows your health care provider to track your heart activity for several days if needed.  Stress tests by exercise or by giving medicine that makes the heart beat faster. TREATMENT  Treatment of dizziness depends on the cause of your symptoms and can vary greatly. HOME CARE INSTRUCTIONS   Drink enough fluids to keep your urine clear or pale yellow. This is especially important in very hot weather. In older adults, it is also important in cold weather.  Take your medicine exactly as directed if your dizziness is caused by medicines. When taking blood pressure medicines, it is especially important to get up slowly.  Rise slowly from chairs and steady yourself until you feel okay.  In the morning, first sit up on the side of the bed. When you feel okay, stand slowly while holding onto something until you know your balance is fine.  Move your legs often if you need to stand in one place for a long time. Tighten and relax your muscles in your legs while standing.  Have someone stay with you for 1-2 days if dizziness continues to be a problem. Do this until you feel you are well enough to stay alone. Have the person call your health care provider if he or she notices changes in you that are concerning.  Do not drive or use heavy machinery if you feel dizzy.  Do not drink alcohol. SEEK IMMEDIATE MEDICAL CARE IF:   Your dizziness or light-headedness gets worse.  You feel nauseous or vomit.  You have problems talking, walking,  or using your arms, hands, or legs.  You feel weak.  You are not thinking clearly or you have trouble forming sentences. It may take a friend or family member to notice this.  You have chest pain, abdominal pain, shortness of breath, or sweating.  Your vision changes.  You notice any bleeding.  You have side effects from medicine that seems to be getting worse rather than better. MAKE SURE YOU:   Understand these instructions.  Will watch your condition.  Will get help right away if you are not doing well or get worse. Document Released: 05/16/2001 Document Revised: 11/25/2013 Document Reviewed: 06/09/2011 [facility] Patient Information 2015 [place], [place]. This information is not intended to replace advice given to you by your health care provider. Make sure you discuss any questions you have with your health care provider.

## 2015-08-12 ENCOUNTER — Other Ambulatory Visit: Payer: Self-pay | Admitting: Obstetrics and Gynecology

## 2015-09-03 ENCOUNTER — Inpatient Hospital Stay (HOSPITAL_COMMUNITY)
Admission: AD | Admit: 2015-09-03 | Discharge: 2015-09-06 | DRG: 775 | Disposition: A | Discharge status: Home / Self Care | Payer: Medicaid Other | Source: Ambulatory Visit | Attending: Obstetrics and Gynecology | Admitting: Obstetrics and Gynecology

## 2015-09-03 ENCOUNTER — Encounter (HOSPITAL_COMMUNITY): Payer: Self-pay | Admitting: *Deleted

## 2015-09-03 DIAGNOSIS — Z3A39 39 weeks gestation of pregnancy: Secondary | ICD-10-CM

## 2015-09-03 DIAGNOSIS — Z87891 Personal history of nicotine dependence: Secondary | ICD-10-CM

## 2015-09-03 DIAGNOSIS — O99214 Obesity complicating childbirth: Secondary | ICD-10-CM | POA: Diagnosis present

## 2015-09-03 DIAGNOSIS — O134 Gestational [pregnancy-induced] hypertension without significant proteinuria, complicating childbirth: Secondary | ICD-10-CM | POA: Diagnosis present

## 2015-09-03 DIAGNOSIS — O139 Gestational [pregnancy-induced] hypertension without significant proteinuria, unspecified trimester: Secondary | ICD-10-CM | POA: Diagnosis present

## 2015-09-03 DIAGNOSIS — O1494 Unspecified pre-eclampsia, complicating childbirth: Secondary | ICD-10-CM | POA: Diagnosis present

## 2015-09-03 LAB — COMPREHENSIVE METABOLIC PANEL
ALBUMIN: 2.7 g/dL — AB (ref 3.5–5.0)
ALK PHOS: 91 U/L (ref 38–126)
ALT: 12 U/L — AB (ref 14–54)
AST: 19 U/L (ref 15–41)
Anion gap: 8 (ref 5–15)
BUN: 8 mg/dL (ref 6–20)
CALCIUM: 8.9 mg/dL (ref 8.9–10.3)
CHLORIDE: 107 mmol/L (ref 101–111)
CO2: 21 mmol/L — AB (ref 22–32)
CREATININE: 0.5 mg/dL (ref 0.44–1.00)
GFR calc Af Amer: >60 mL/min (ref >60)
GFR calc non Af Amer: >60 mL/min (ref >60)
Glucose, Bld: 103 mg/dL — ABNORMAL HIGH (ref 65–99)
Potassium: 3.9 mmol/L (ref 3.5–5.1)
SODIUM: 136 mmol/L (ref 135–145)
Total Bilirubin: 0.5 mg/dL (ref 0.3–1.2)
Total Protein: 6.1 g/dL — ABNORMAL LOW (ref 6.5–8.1)

## 2015-09-03 LAB — CBC
HEMATOCRIT: 35.5 % — AB (ref 36.0–46.0)
HEMOGLOBIN: 11.5 g/dL — AB (ref 12.0–15.0)
MCH: 28.4 pg (ref 26.0–34.0)
MCHC: 32.4 g/dL (ref 30.0–36.0)
MCV: 87.7 fL (ref 78.0–100.0)
PLATELETS: 160 10*3/uL (ref 150–400)
RBC: 4.05 MIL/uL (ref 3.87–5.11)
RDW: 16.3 % — ABNORMAL HIGH (ref 11.5–15.5)
WBC: 9.3 10*3/uL (ref 4.0–10.5)

## 2015-09-03 LAB — OB RESULTS CONSOLE GC/CHLAMYDIA
Chlamydia: NEGATIVE
Gonorrhea: NEGATIVE

## 2015-09-03 LAB — OB RESULTS CONSOLE ANTIBODY SCREEN: Antibody Screen: NEGATIVE

## 2015-09-03 LAB — LACTATE DEHYDROGENASE: LDH: 159 U/L (ref 98–192)

## 2015-09-03 LAB — TYPE AND SCREEN
ABO/RH(D): O POS
Antibody Screen: NEGATIVE

## 2015-09-03 LAB — ABO/RH: ABO/RH(D): O POS

## 2015-09-03 LAB — URIC ACID: Uric Acid, Serum: 4.3 mg/dL (ref 2.3–6.6)

## 2015-09-03 LAB — OB RESULTS CONSOLE HIV ANTIBODY (ROUTINE TESTING): HIV: NONREACTIVE

## 2015-09-03 LAB — OB RESULTS CONSOLE ABO/RH: RH TYPE: POSITIVE

## 2015-09-03 LAB — OB RESULTS CONSOLE HEPATITIS B SURFACE ANTIGEN: Hepatitis B Surface Ag: NEGATIVE

## 2015-09-03 LAB — OB RESULTS CONSOLE RPR: RPR: NONREACTIVE

## 2015-09-03 LAB — OB RESULTS CONSOLE RUBELLA ANTIBODY, IGM: RUBELLA: IMMUNE

## 2015-09-03 LAB — OB RESULTS CONSOLE GBS: GBS: NEGATIVE

## 2015-09-03 MED ORDER — CITRIC ACID-SODIUM CITRATE 334-500 MG/5ML PO SOLN: 30.0000 mL | ORAL | Status: DC | PRN

## 2015-09-03 MED ORDER — TERBUTALINE SULFATE 1 MG/ML IJ SOLN: 0.2500 mg | Freq: Once | INTRAMUSCULAR | Status: DC | PRN

## 2015-09-03 MED ORDER — HYDRALAZINE HCL 20 MG/ML IJ SOLN: 10.0000 mg | Freq: Once | INTRAMUSCULAR | Status: DC | PRN

## 2015-09-03 MED ORDER — LABETALOL HCL 5 MG/ML IV SOLN: 20.0000 mg | INTRAVENOUS | Status: DC | PRN

## 2015-09-03 MED ORDER — BUTORPHANOL TARTRATE 1 MG/ML IJ SOLN
1.0000 mg | INTRAMUSCULAR | Status: DC | PRN
  Administered 2015-09-04: 1 mg via INTRAVENOUS
  Filled 2015-09-03: qty 1

## 2015-09-03 MED ORDER — ACETAMINOPHEN 325 MG PO TABS: 650.0000 mg | ORAL_TABLET | ORAL | Status: DC | PRN

## 2015-09-03 MED ORDER — LACTATED RINGERS IV SOLN
500.0000 mL | INTRAVENOUS | Status: DC | PRN
  Administered 2015-09-04 (×3): 500 mL via INTRAVENOUS

## 2015-09-03 MED ORDER — OXYCODONE-ACETAMINOPHEN 5-325 MG PO TABS: 1.0000 | ORAL_TABLET | ORAL | Status: DC | PRN

## 2015-09-03 MED ORDER — ONDANSETRON HCL 4 MG/2ML IJ SOLN
4.0000 mg | Freq: Four times a day (QID) | INTRAMUSCULAR | Status: DC | PRN
  Administered 2015-09-04: 4 mg via INTRAVENOUS
  Filled 2015-09-03: qty 2

## 2015-09-03 MED ORDER — OXYTOCIN 40 UNITS IN LACTATED RINGERS INFUSION - SIMPLE MED
62.5000 mL/h | INTRAVENOUS | Status: DC
  Filled 2015-09-03: qty 1000

## 2015-09-03 MED ORDER — LIDOCAINE HCL (PF) 1 % IJ SOLN: 30.0000 mL | INTRAMUSCULAR | Status: DC | PRN

## 2015-09-03 MED ORDER — OXYCODONE-ACETAMINOPHEN 5-325 MG PO TABS: 2.0000 | ORAL_TABLET | ORAL | Status: DC | PRN

## 2015-09-03 MED ORDER — MISOPROSTOL 25 MCG QUARTER TABLET
25.0000 ug | ORAL_TABLET | ORAL | Status: DC | PRN
  Administered 2015-09-03 (×2): 25 ug via VAGINAL
  Filled 2015-09-03 (×3): qty 0.25

## 2015-09-03 MED ORDER — ZOLPIDEM TARTRATE 5 MG PO TABS
5.0000 mg | ORAL_TABLET | Freq: Every evening | ORAL | Status: DC | PRN
  Administered 2015-09-04: 5 mg via ORAL
  Filled 2015-09-03: qty 1

## 2015-09-03 MED ORDER — LACTATED RINGERS IV SOLN
INTRAVENOUS | Status: DC
  Administered 2015-09-03 – 2015-09-04 (×4): via INTRAVENOUS

## 2015-09-03 MED ORDER — OXYTOCIN BOLUS FROM INFUSION: 500.0000 mL | INTRAVENOUS | Status: DC

## 2015-09-03 NOTE — Plan of Care (Signed)
Problem: Consults Goal: Birthing Suites Patient Information Press F2 to bring up selections list  Outcome: Completed/Met Date Met:  09/03/15  Pt 37-[redacted] weeks EGA and Inpatient induction, gestational hypertension

## 2015-09-03 NOTE — H&P (Signed)
Kathryn Carr is a 24 y.o. female presenting for induction for gestational hypertension  24 yo G1P0 @ 39+1 presents for induction of labor after she was noted to have elevated blood pressures in the office during her prenatal visit today. Her pregnancy has been complicated by Morbid obesity. She was noted to have echogenic bowel on an ultrasound early in the pregnancy, however she saw MFM and this seemed to have spontaneously resolved. History OB History    Gravida Para Term Preterm AB TAB SAB Ectopic Multiple Living   1              History reviewed. No pertinent past medical history. Past Surgical History  Procedure Laterality Date  . Wisdom tooth extraction     Family History: family history is not on file. Social History:  reports that she quit smoking about 17 months ago. She has never used smokeless tobacco. She reports that she does not drink alcohol or use illicit drugs.   Prenatal Transfer Tool  Maternal Diabetes: No Genetic Screening: Normal Maternal Ultrasounds/Referrals: Abnormal:  Findings:   Echogenic bowel Fetal Ultrasounds or other Referrals:  Referred to Materal Fetal Medicine  Maternal Substance Abuse:  No Significant Maternal Medications:  None Significant Maternal Lab Results:  None Other Comments:  None  ROS    Blood pressure 129/66, pulse 101, resp. rate 16, height  (1.575 m), weight 125.646 kg (277 lb), last menstrual period 12/03/2014. Exam Physical Exam  Prenatal labs: ABO, Rh:  O + Antibody:  Neg Rubella:  Imm RPR:   NR HBsAg:   neg HIV:   NR GBS:   neg  Assessment/Plan: 1) Admit 2) CMP/LDH/uric acid 3) Labetalol prn 4) Magnesium SO4 if evidence of Pre-eclampsia 5) Epidural on request   ROSS,KENDRA H. 09/03/2015, 6:09 PM

## 2015-09-04 ENCOUNTER — Encounter (HOSPITAL_COMMUNITY): Payer: Self-pay | Admitting: General Practice

## 2015-09-04 ENCOUNTER — Inpatient Hospital Stay (HOSPITAL_COMMUNITY): Payer: Medicaid Other | Admitting: Anesthesiology

## 2015-09-04 LAB — CBC
HCT: 38.7 % (ref 36.0–46.0)
HEMOGLOBIN: 12.6 g/dL (ref 12.0–15.0)
MCH: 28.8 pg (ref 26.0–34.0)
MCHC: 32.6 g/dL (ref 30.0–36.0)
MCV: 88.4 fL (ref 78.0–100.0)
Platelets: 136 10*3/uL — ABNORMAL LOW (ref 150–400)
RBC: 4.38 MIL/uL (ref 3.87–5.11)
RDW: 16.4 % — ABNORMAL HIGH (ref 11.5–15.5)
WBC: 17.2 10*3/uL — AB (ref 4.0–10.5)

## 2015-09-04 LAB — RPR: RPR: NONREACTIVE

## 2015-09-04 MED ORDER — OXYCODONE-ACETAMINOPHEN 5-325 MG PO TABS
1.0000 | ORAL_TABLET | ORAL | Status: DC | PRN
  Administered 2015-09-05 (×2): 1 via ORAL
  Filled 2015-09-04 (×3): qty 1

## 2015-09-04 MED ORDER — INFLUENZA VAC SPLIT QUAD 0.5 ML IM SUSY
0.5000 mL | PREFILLED_SYRINGE | INTRAMUSCULAR | Status: AC
  Administered 2015-09-06: 0.5 mL via INTRAMUSCULAR
  Filled 2015-09-04: qty 0.5

## 2015-09-04 MED ORDER — ONDANSETRON HCL 4 MG PO TABS: 4.0000 mg | ORAL_TABLET | ORAL | Status: DC | PRN

## 2015-09-04 MED ORDER — SENNOSIDES-DOCUSATE SODIUM 8.6-50 MG PO TABS
2.0000 | ORAL_TABLET | ORAL | Status: DC
  Administered 2015-09-04 – 2015-09-06 (×2): 2 via ORAL
  Filled 2015-09-04 (×2): qty 2

## 2015-09-04 MED ORDER — ONDANSETRON HCL 4 MG/2ML IJ SOLN: 4.0000 mg | INTRAMUSCULAR | Status: DC | PRN

## 2015-09-04 MED ORDER — WITCH HAZEL-GLYCERIN EX PADS: 1.0000 "application " | MEDICATED_PAD | CUTANEOUS | Status: DC | PRN

## 2015-09-04 MED ORDER — DIPHENHYDRAMINE HCL 25 MG PO CAPS: 25.0000 mg | ORAL_CAPSULE | Freq: Four times a day (QID) | ORAL | Status: DC | PRN

## 2015-09-04 MED ORDER — EPHEDRINE 5 MG/ML INJ: 10.0000 mg | INTRAVENOUS | Status: DC | PRN

## 2015-09-04 MED ORDER — ZOLPIDEM TARTRATE 5 MG PO TABS: 5.0000 mg | ORAL_TABLET | Freq: Every evening | ORAL | Status: DC | PRN

## 2015-09-04 MED ORDER — TETANUS-DIPHTH-ACELL PERTUSSIS 5-2.5-18.5 LF-MCG/0.5 IM SUSP: 0.5000 mL | Freq: Once | INTRAMUSCULAR | Status: DC

## 2015-09-04 MED ORDER — PHENYLEPHRINE 40 MCG/ML (10ML) SYRINGE FOR IV PUSH (FOR BLOOD PRESSURE SUPPORT)
80.0000 ug | PREFILLED_SYRINGE | INTRAVENOUS | Status: DC | PRN
  Administered 2015-09-04 (×2): 80 ug via INTRAVENOUS
  Filled 2015-09-04: qty 20

## 2015-09-04 MED ORDER — FENTANYL 2.5 MCG/ML BUPIVACAINE 1/10 % EPIDURAL INFUSION (WH - ANES)
14.0000 mL/h | INTRAMUSCULAR | Status: DC | PRN
Start: 2015-09-04 — End: 2015-09-04
  Administered 2015-09-04: 14 mL/h via EPIDURAL
  Filled 2015-09-04 (×2): qty 125

## 2015-09-04 MED ORDER — SIMETHICONE 80 MG PO CHEW: 80.0000 mg | CHEWABLE_TABLET | ORAL | Status: DC | PRN

## 2015-09-04 MED ORDER — LANOLIN HYDROUS EX OINT: TOPICAL_OINTMENT | CUTANEOUS | Status: DC | PRN

## 2015-09-04 MED ORDER — OXYTOCIN 40 UNITS IN LACTATED RINGERS INFUSION - SIMPLE MED
1.0000 m[IU]/min | INTRAVENOUS | Status: DC
  Administered 2015-09-04: 2 m[IU]/min via INTRAVENOUS

## 2015-09-04 MED ORDER — DIPHENHYDRAMINE HCL 50 MG/ML IJ SOLN: 12.5000 mg | INTRAMUSCULAR | Status: DC | PRN

## 2015-09-04 MED ORDER — TERBUTALINE SULFATE 1 MG/ML IJ SOLN: 0.2500 mg | Freq: Once | INTRAMUSCULAR | Status: DC | PRN

## 2015-09-04 MED ORDER — DIBUCAINE 1 % RE OINT: 1.0000 "application " | TOPICAL_OINTMENT | RECTAL | Status: DC | PRN

## 2015-09-04 MED ORDER — BENZOCAINE-MENTHOL 20-0.5 % EX AERO
1.0000 "application " | INHALATION_SPRAY | CUTANEOUS | Status: DC | PRN
  Filled 2015-09-04: qty 56

## 2015-09-04 MED ORDER — OXYCODONE-ACETAMINOPHEN 5-325 MG PO TABS: 2.0000 | ORAL_TABLET | ORAL | Status: DC | PRN

## 2015-09-04 MED ORDER — PROMETHAZINE HCL 25 MG/ML IJ SOLN
25.0000 mg | INTRAMUSCULAR | Status: DC | PRN
  Administered 2015-09-04 (×2): 25 mg via INTRAVENOUS
  Filled 2015-09-04 (×2): qty 1

## 2015-09-04 MED ORDER — IBUPROFEN 600 MG PO TABS
600.0000 mg | ORAL_TABLET | Freq: Four times a day (QID) | ORAL | Status: DC
  Administered 2015-09-04 – 2015-09-06 (×7): 600 mg via ORAL
  Filled 2015-09-04 (×6): qty 1

## 2015-09-04 MED ORDER — PRENATAL MULTIVITAMIN CH
1.0000 | ORAL_TABLET | Freq: Every day | ORAL | Status: DC
  Administered 2015-09-05 – 2015-09-06 (×2): 1 via ORAL
  Filled 2015-09-04 (×2): qty 1

## 2015-09-04 MED ORDER — ACETAMINOPHEN 325 MG PO TABS: 650.0000 mg | ORAL_TABLET | ORAL | Status: DC | PRN

## 2015-09-04 NOTE — Progress Notes (Signed)
Epidural line removed at 2000 catheter intact

## 2015-09-04 NOTE — Progress Notes (Signed)
Patient ID: Kathryn Carr, female   DOB: Aug 08, 1991, 24 y.o.   MRN: [id number]  S: Pt with vomiting all night, got epidural  O:  Filed Vitals:   09/04/15 0720 09/04/15 0726 09/04/15 0731 09/04/15 0737  BP: 127/64 119/66 115/63 116/51  Pulse: 70 69 71 108  Temp:      TempSrc:      Resp: 18  18   Height:      Weight:      SpO2:       FHR 140s, reassuring, cat 1 tracing cvx 6-7/80/-2 toco difficult to trace  AROM clear fluid  A/P 1) FWB reassuring 2) Antiemetics prn 3) BPs well controlled

## 2015-09-04 NOTE — Progress Notes (Signed)
Patient ID: Kathryn Carr, female   DOB: 1991-07-18, 24 y.o.   MRN: [id number]  S: Sleepy O: Filed Vitals:   09/04/15 0831 09/04/15 0901 09/04/15 0907 09/04/15 0929  BP: 113/76 116/73  128/74  Pulse: 85 91  86  Temp:  97.6 F (36.4 C) 97.6 F (36.4 C)   TempSrc:  Oral Axillary   Resp:      Height:      Weight:      SpO2:       FHR 160-180 with variable decels, + scalp stim Cvx 6/80/-1 toco irreg  IUPC placed Overall FWB reassuring Variability decrease likely related to IV phenergan

## 2015-09-05 LAB — CBC
HCT: 32.8 % — ABNORMAL LOW (ref 36.0–46.0)
HEMOGLOBIN: 10.5 g/dL — AB (ref 12.0–15.0)
MCH: 28.2 pg (ref 26.0–34.0)
MCHC: 32 g/dL (ref 30.0–36.0)
MCV: 88.2 fL (ref 78.0–100.0)
PLATELETS: 186 10*3/uL (ref 150–400)
RBC: 3.72 MIL/uL — AB (ref 3.87–5.11)
RDW: 16.8 % — ABNORMAL HIGH (ref 11.5–15.5)
WBC: 21.7 10*3/uL — AB (ref 4.0–10.5)

## 2015-09-05 MED ORDER — FENTANYL 2.5 MCG/ML BUPIVACAINE 1/10 % EPIDURAL INFUSION (WH - ANES)
INTRAMUSCULAR | Status: DC | PRN
  Administered 2015-09-04: 12 mL/h via EPIDURAL

## 2015-09-05 MED ORDER — BUPIVACAINE HCL (PF) 0.25 % IJ SOLN
INTRAMUSCULAR | Status: DC | PRN
  Administered 2015-09-04 (×2): 4 mL via EPIDURAL

## 2015-09-05 MED ORDER — LIDOCAINE-EPINEPHRINE (PF) 2 %-1:200000 IJ SOLN
INTRAMUSCULAR | Status: DC | PRN
  Administered 2015-09-04: 4 mL

## 2015-09-05 NOTE — Anesthesia Preprocedure Evaluation (Signed)
Anesthesia Evaluation  Patient identified by MRN, date of birth, ID band Patient awake    Reviewed: Allergy & Precautions, Patient's Chart, lab work & pertinent test results  History of Anesthesia Complications Negative for: history of anesthetic complications  Airway Mallampati: III  TM Distance: >3 FB Neck ROM: Full    Dental  (+) Teeth Intact   Pulmonary neg pulmonary ROS, former smoker,    breath sounds clear to auscultation       Cardiovascular negative cardio ROS   Rhythm:Regular     Neuro/Psych negative neurological ROS  negative psych ROS   GI/Hepatic negative GI ROS, Neg liver ROS,   Endo/Other  Morbid obesity  Renal/GU negative Renal ROS     Musculoskeletal   Abdominal   Peds  Hematology negative hematology ROS (+)   Anesthesia Other Findings   Reproductive/Obstetrics (+) Pregnancy                             Anesthesia Physical Anesthesia Plan  ASA: II  Anesthesia Plan: Epidural   Post-op Pain Management:    Induction:   Airway Management Planned:   Additional Equipment:   Intra-op Plan:   Post-operative Plan:   Informed Consent: I have reviewed the patients History and Physical, chart, labs and discussed the procedure including the risks, benefits and alternatives for the proposed anesthesia with the patient or authorized representative who has indicated his/her understanding and acceptance.   Dental advisory given  Plan Discussed with: Anesthesiologist  Anesthesia Plan Comments:         Anesthesia Quick Evaluation

## 2015-09-05 NOTE — Progress Notes (Signed)
Post Partum Day 1 Subjective: no complaints, up ad lib, voiding, tolerating PO and + flatus  Objective: Blood pressure 106/47, pulse 102, temperature 98.1 F (36.7 C), temperature source Oral, resp. rate 20, height  (1.575 m), weight 125.646 kg (277 lb), last menstrual period 12/03/2014, SpO2 98 %, unknown if currently breastfeeding.    Intake/Output Summary (Last 24 hours) at 09/05/15 1005 Last data filed at 09/04/15 1945  Gross per 24 hour  Intake      0 ml  Output    470 ml  Net   -470 ml     Physical Exam:  General: alert, cooperative and appears stated age Lochia: appropriate Uterine Fundus: firm   Recent Labs  09/04/15 0545 09/05/15 0540  HGB 12.6 10.5*  HCT 38.7 32.8*    Assessment/Plan: Plan for discharge tomorrow and Breastfeeding   LOS: 2 days   Alder Murri H. 09/05/2015, 10:04 AM

## 2015-09-05 NOTE — Lactation Note (Signed)
This note was copied from the chart of Kathryn Catheryn Nuncio. Lactation Consultation Note   P1.  Hand expression taught to Mom. Drops of colostrum expressed. Assisted mother w/ latching in football hold. Showed mother how to compress breast to achieve depth. Sucks and swallows observed.  Mother needed lots of education and encouragement but very happy to feel tugs and pulls. Mother will return to work in 6 weeks.  Discussed pumping strategies and milk storage. Mom encouraged to feed baby 8-12 times/24 hours and with feeding cues.  Mom made aware of O/P services, breastfeeding support groups, community resources, and our phone # for post-discharge questions.       Patient Name: Kathryn Carr [id number] Date: 09/05/2015 Reason for consult: Initial assessment   Maternal Data Has patient been taught Hand Expression?: Yes Does the patient have breastfeeding experience prior to this delivery?: No  Feeding Feeding Type: Breast Fed Length of feed: 10 min  LATCH Score/Interventions Latch: Grasps breast easily, tongue down, lips flanged, rhythmical sucking.  Audible Swallowing: A few with stimulation  Type of Nipple: Everted at rest and after stimulation  Comfort (Breast/Nipple): Soft / non-tender     Hold (Positioning): Assistance needed to correctly position infant at breast and maintain latch.  LATCH Score: 8  Lactation Tools Discussed/Used     Consult Status Consult Status: Follow-up Date: 09/06/15 Follow-up type: In-patient    Dahlia Byes [facility] 09/05/2015, 11:57 AM

## 2015-09-05 NOTE — Anesthesia Procedure Notes (Addendum)
Epidural Patient location during procedure: OB  Staffing Anesthesiologist: Aizley Stenseth Performed by: anesthesiologist   Preanesthetic Checklist Completed: patient identified, surgical consent, pre-op evaluation, timeout performed, IV checked, risks and benefits discussed and monitors and equipment checked  Epidural Patient position: sitting Prep: DuraPrep Patient monitoring: heart rate, cardiac monitor, continuous pulse ox and blood pressure Approach: midline Location: L3-L4 Injection technique: LOR saline  Needle:  Needle type: Tuohy  Needle gauge: 17 G Needle length: 9 cm Needle insertion depth: 9 cm Catheter type: closed end flexible Catheter size: 19 Gauge Catheter at skin depth: 16 cm Test dose: negative and 2% lidocaine with Epi 1:200 K  Assessment Events: blood not aspirated, injection not painful, no injection resistance, negative IV test and no paresthesia  Additional Notes Reason for block:procedure for pain

## 2015-09-05 NOTE — Anesthesia Postprocedure Evaluation (Signed)
  Anesthesia Post-op Note  Patient: Kathryn Carr  Procedure(s) Performed: * No procedures listed *  Patient Location: Mother/Baby  Anesthesia Type:Epidural  Level of Consciousness: awake, alert , oriented and patient cooperative  Airway and Oxygen Therapy: Patient Spontanous Breathing  Post-op Pain: mild  Post-op Assessment: Post-op Vital signs reviewed, Patient's Cardiovascular Status Stable, Respiratory Function Stable, Patent Airway, No signs of Nausea or vomiting, Adequate PO intake, Pain level controlled, No headache, No backache and Patient able to bend at knees              Post-op Vital Signs: Reviewed and stable  Last Vitals:  Filed Vitals:   09/05/15 0620  BP: 106/47  Pulse: 102  Temp: 36.7 C  Resp: 20    Complications: No apparent anesthesia complications

## 2015-09-06 NOTE — Progress Notes (Signed)
PPD#2 Pt without complaints. No headache or vision changes. B/P ok. WBC increased to 21K, no F/C , exam neg IMP/ Stable Plan/Will discharge to home and follow.

## 2015-09-06 NOTE — Discharge Summary (Signed)
Obstetric Discharge Summary Reason for Admission: induction of labor Prenatal Procedures: NST and ultrasound Intrapartum Procedures: spontaneous vaginal delivery Postpartum Procedures: none Complications-Operative and Postpartum: none HEMOGLOBIN  Date Value Ref Range Status  09/05/2015 10.5* 12.0 - 15.0 g/dL Final   HCT  Date Value Ref Range Status  09/05/2015 32.8* 36.0 - 46.0 % Final    Physical Exam:  General: alert Lochia: appropriate Uterine Fundus: firm   Discharge Diagnoses: Term Pregnancy-delivered and Preelampsia  Discharge Information: Date: 09/06/2015 Activity: pelvic rest Diet: routine Medications: PNV and Ibuprofen Condition: stable Instructions: refer to practice specific booklet Discharge to: home Follow-up Information    Follow up with Almon Hercules., MD. Schedule an appointment as soon as possible for a visit in 1 month.   Specialty:  Obstetrics and Gynecology   Contact information:   [address] [place] [address] [phone]       Newborn Data: Live born female  Birth Weight: 8 lb 1 oz (3657 g) APGAR: 8, 9  Home with mother.  ANDERSON,MARK E 09/06/2015, 9:05 AM

## 2015-09-06 NOTE — Lactation Note (Signed)
This note was copied from the chart of Kathryn Carr. Lactation Consultation Note  Patient Name: Kathryn Carr [id number] Date: 09/06/2015 Reason for consult: Follow-up assessment Baby 39 hours old. Mom reports that baby is nursing well on from both breasts now. Parents also states that baby is nursing longer, and not having the short feeds that he did earlier. Parents state that they are seeing the baby swallow at breast as well. Mom had questions about milk "coming in" which were answered. Referred parents to Baby and Me booklet for number of diapers to expect by day of life and EBM storage guidelines. Mom aware of OP/BFSG and LC phone line assistance after D/C.  Maternal Data    Feeding    LATCH Score/Interventions                      Lactation Tools Discussed/Used     Consult Status Consult Status: Complete    Karolina Zamor 09/06/2015, 11:00 AM

## 2015-09-06 NOTE — Progress Notes (Signed)
Pt verbalizes understanding of d/c instructions, medications, follow up appts, when to seek medical attention and belongings policy. IV was removed prior to d/c. Pt has no questions at this time. Pt to be d/c to main entrance. Pts husband is at bedside and will be driving her home. Pt has info at time of dc. Sheryn Bison

## 2019-12-22 ENCOUNTER — Ambulatory Visit: Payer: BC Managed Care – PPO | Attending: Internal Medicine

## 2019-12-22 DIAGNOSIS — Z20822 Contact with and (suspected) exposure to covid-19: Secondary | ICD-10-CM

## 2019-12-23 LAB — NOVEL CORONAVIRUS, NAA: SARS-CoV-2, NAA: NOT DETECTED

## 2019-12-24 ENCOUNTER — Telehealth: Payer: Self-pay | Admitting: General Practice

## 2019-12-24 NOTE — Telephone Encounter (Addendum)
Pt aware covid lab test negative, not detected °

## 2020-04-07 ENCOUNTER — Other Ambulatory Visit: Payer: Self-pay

## 2020-04-07 ENCOUNTER — Ambulatory Visit (HOSPITAL_COMMUNITY)
Admission: EM | Admit: 2020-04-07 | Discharge: 2020-04-07 | Disposition: A | Discharge status: Home / Self Care | Payer: BC Managed Care – PPO | Attending: Urgent Care | Admitting: Urgent Care

## 2020-04-07 DIAGNOSIS — Z3202 Encounter for pregnancy test, result negative: Secondary | ICD-10-CM

## 2020-04-07 DIAGNOSIS — L299 Pruritus, unspecified: Secondary | ICD-10-CM

## 2020-04-07 DIAGNOSIS — R630 Anorexia: Secondary | ICD-10-CM | POA: Diagnosis present

## 2020-04-07 DIAGNOSIS — R42 Dizziness and giddiness: Secondary | ICD-10-CM

## 2020-04-07 DIAGNOSIS — R110 Nausea: Secondary | ICD-10-CM | POA: Diagnosis not present

## 2020-04-07 LAB — COMPREHENSIVE METABOLIC PANEL
ALT: 14 U/L (ref 0–44)
AST: 18 U/L (ref 15–41)
Albumin: 3.8 g/dL (ref 3.5–5.0)
Alkaline Phosphatase: 50 U/L (ref 38–126)
Anion gap: 10 (ref 5–15)
BUN: 8 mg/dL (ref 6–20)
CO2: 27 mmol/L (ref 22–32)
Calcium: 9.1 mg/dL (ref 8.9–10.3)
Chloride: 102 mmol/L (ref 98–111)
Creatinine, Ser: 0.69 mg/dL (ref 0.44–1.00)
GFR calc Af Amer: >60 mL/min (ref >60)
GFR calc non Af Amer: >60 mL/min (ref >60)
Glucose, Bld: 85 mg/dL (ref 70–99)
Potassium: 3.6 mmol/L (ref 3.5–5.1)
Sodium: 139 mmol/L (ref 135–145)
Total Bilirubin: 0.5 mg/dL (ref 0.3–1.2)
Total Protein: 7.3 g/dL (ref 6.5–8.1)

## 2020-04-07 LAB — CBC
HCT: 41.4 % (ref 36.0–46.0)
Hemoglobin: 13.6 g/dL (ref 12.0–15.0)
MCH: 30 pg (ref 26.0–34.0)
MCHC: 32.9 g/dL (ref 30.0–36.0)
MCV: 91.2 fL (ref 80.0–100.0)
Platelets: 268 10*3/uL (ref 150–400)
RBC: 4.54 MIL/uL (ref 3.87–5.11)
RDW: 12.5 % (ref 11.5–15.5)
WBC: 7.2 10*3/uL (ref 4.0–10.5)
nRBC: 0 % (ref 0.0–0.2)

## 2020-04-07 LAB — POCT URINALYSIS DIP (DEVICE)
Bilirubin Urine: NEGATIVE
Glucose, UA: NEGATIVE mg/dL
Ketones, ur: NEGATIVE mg/dL
Leukocytes,Ua: NEGATIVE
Nitrite: NEGATIVE
Protein, ur: NEGATIVE mg/dL
Specific Gravity, Urine: 1.025 (ref 1.005–1.030)
Urobilinogen, UA: 0.2 mg/dL (ref 0.0–1.0)
pH: 5.5 (ref 5.0–8.0)

## 2020-04-07 LAB — POC URINE PREG, ED: Preg Test, Ur: NEGATIVE

## 2020-04-07 MED ORDER — HYDROXYZINE HCL 25 MG PO TABS: 12.5000 mg | ORAL_TABLET | Freq: Three times a day (TID) | ORAL | 0 refills | Status: DC | PRN

## 2020-04-07 MED ORDER — MECLIZINE HCL 12.5 MG PO TABS: 12.5000 mg | ORAL_TABLET | Freq: Three times a day (TID) | ORAL | 0 refills | Status: DC | PRN

## 2020-04-07 NOTE — ED Triage Notes (Signed)
Pt c/o dizziness x 2 weeks. Worse after standing for long periods of time at work.  All over itching x 2 days. No interventions at home.

## 2020-04-07 NOTE — ED Provider Notes (Signed)
[facility]   MRN: [id number] DOB: 03-Sep-1991  Subjective:   Kathryn Carr is a 29 y.o. female presenting for 2 week history of persistent dizziness worse after work, stands and walks for long periods of time. 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 Would like to get labs since she does not have a primary care provider.  She is trying for pregnancy and would like to have a urine pregnancy test.  She did notice some vaginal bleeding in the past few days and wants to make sure it is not implantation bleeding.  No current facility-administered medications for this encounter.  Current Outpatient Medications:  .  Fe Fum-FePoly-FA-Vit C-Vit B3 (INTEGRA F) 125-1 MG CAPS, Take 1 capsule by mouth daily. (Patient not taking: Reported on 09/03/2015), Disp: 30 capsule, Rfl: 3 .  ferrous sulfate 325 (65 FE) MG tablet, Take 325 mg by mouth daily with breakfast., Disp: , Rfl:  .  Prenatal Vit-Fe Fumarate-FA (PRENATAL MULTIVITAMIN) TABS tablet, Take 1 tablet by mouth daily at 12 noon., Disp: , Rfl:    Allergies  Allergen Reactions  . Penicillins Hives    Has patient had a PCN reaction causing immediate rash, facial/tongue/throat swelling, SOB or lightheadedness with hypotension: Yes Has patient had a PCN reaction causing severe rash involving mucus membranes or skin necrosis: Yes Has patient had a PCN reaction that required hospitalization No Has patient had a PCN reaction occurring within the last 10 years: No If all of the above answers are "NO", then may proceed with Cephalosporin use.     No past medical history on file.   Past Surgical History:  Procedure Laterality Date  . WISDOM TOOTH EXTRACTION      No family history on file.  Social History   Tobacco Use  . Smoking status: Former Smoker    Quit date: 03/20/2014    Years since quitting: 6.0  . Smokeless tobacco: Never Used  Substance Use Topics  . Alcohol use: No    Alcohol/week: 0.0 standard drinks  . Drug use: No     Review of Systems  Constitutional: Negative for fever and malaise/fatigue.  HENT: Negative for congestion, ear pain, sinus pain and sore throat.   Eyes: Negative for discharge and redness.  Respiratory: Negative for cough, hemoptysis, shortness of breath and wheezing.   Cardiovascular: Negative for chest pain.  Gastrointestinal: Positive for nausea (intermittent). Negative for abdominal pain, diarrhea and vomiting.       Decreased appetite.   Genitourinary: Negative for dysuria, flank pain and hematuria.  Musculoskeletal: Negative for myalgias.  Skin: Positive for itching (Without a rash). Negative for rash.  Neurological: Positive for dizziness. Negative for weakness and headaches.  Psychiatric/Behavioral: Negative for depression and substance abuse.    Objective:   Vitals: BP 115/73   Pulse 72   Temp 98.3 F (36.8 C)   Resp 16   LMP 04/07/2020   SpO2 100%   Physical Exam Constitutional:      General: She is not in acute distress.    Appearance: Normal appearance. She is well-developed. She is not ill-appearing, toxic-appearing or diaphoretic.  HENT:     Head: Normocephalic and atraumatic.     Right Ear: Tympanic membrane and ear canal normal. No drainage or tenderness. No middle ear effusion. Tympanic membrane is not erythematous.     Left Ear: Tympanic membrane and ear canal normal. No drainage or tenderness.  No middle ear effusion. Tympanic membrane is not erythematous.  Nose: Nose normal. No congestion or rhinorrhea.     Mouth/Throat:     Mouth: Mucous membranes are moist. No oral lesions.     Pharynx: Oropharynx is clear. No pharyngeal swelling, oropharyngeal exudate, posterior oropharyngeal erythema or uvula swelling.     Tonsils: No tonsillar exudate or tonsillar abscesses.  Eyes:     Extraocular Movements: Extraocular movements intact.     Right eye: Normal extraocular motion.     Left eye: Normal extraocular motion.     Conjunctiva/sclera: Conjunctivae  normal.     Pupils: Pupils are equal, round, and reactive to light.  Cardiovascular:     Rate and Rhythm: Normal rate and regular rhythm.     Pulses: Normal pulses.     Heart sounds: Normal heart sounds. No murmur. No friction rub. No gallop.   Pulmonary:     Effort: Pulmonary effort is normal. No respiratory distress.     Breath sounds: Normal breath sounds. No stridor. No wheezing, rhonchi or rales.  Musculoskeletal:     Cervical back: Normal range of motion and neck supple.  Lymphadenopathy:     Cervical: No cervical adenopathy.  Skin:    General: Skin is warm and dry.     Findings: No rash.  Neurological:     General: No focal deficit present.     Mental Status: She is alert and oriented to person, place, and time.  Psychiatric:        Mood and Affect: Mood normal.        Behavior: Behavior normal.        Thought Content: Thought content normal.        Judgment: Judgment normal.     Results for orders placed or performed during the hospital encounter of 04/07/20 (from the past 24 hour(s))  POCT urinalysis dip (device)     Status: Abnormal   Collection Time: 04/07/20  3:46 PM  Result Value Ref Range   Glucose, UA NEGATIVE NEGATIVE mg/dL   Bilirubin Urine NEGATIVE NEGATIVE   Ketones, ur NEGATIVE NEGATIVE mg/dL   Specific Gravity, Urine 1.025 1.005 - 1.030   Hgb urine dipstick SMALL (A) NEGATIVE   pH 5.5 5.0 - 8.0   Protein, ur NEGATIVE NEGATIVE mg/dL   Urobilinogen, UA 0.2 0.0 - 1.0 mg/dL   Nitrite NEGATIVE NEGATIVE   Leukocytes,Ua NEGATIVE NEGATIVE  POC urine pregnancy     Status: None   Collection Time: 04/07/20  3:59 PM  Result Value Ref Range   Preg Test, Ur NEGATIVE NEGATIVE    Assessment and Plan :   PDMP not reviewed this encounter.  1. Dizziness   2. Itching   3. Nausea without vomiting   4. Decreased appetite     Physical exam findings very reassuring, vital signs stable for discharge.  Labs pending.  Advised patient that I would contact her only if  the results are abnormal.  Recommended continued follow-up and work-up with her new PCP.  Stop drinking [facility], hydrate better which is plain water.  Use supportive care otherwise.  Counseled patient on potential for adverse effects with medications prescribed/recommended today, ER and return-to-clinic precautions discussed, patient verbalized understanding.    Jaynee Eagles, PA-C 04/09/20 1130

## 2020-11-15 LAB — OB RESULTS CONSOLE GC/CHLAMYDIA
Chlamydia: NEGATIVE
Gonorrhea: NEGATIVE

## 2020-11-18 ENCOUNTER — Other Ambulatory Visit: Payer: Self-pay | Admitting: Obstetrics and Gynecology

## 2020-11-18 ENCOUNTER — Other Ambulatory Visit (HOSPITAL_COMMUNITY): Payer: Self-pay | Admitting: Obstetrics and Gynecology

## 2020-11-18 DIAGNOSIS — O26841 Uterine size-date discrepancy, first trimester: Secondary | ICD-10-CM

## 2020-11-19 LAB — HEPATITIS C ANTIBODY: HCV Ab: NEGATIVE

## 2020-11-19 LAB — OB RESULTS CONSOLE RUBELLA ANTIBODY, IGM: Rubella: IMMUNE

## 2020-11-19 LAB — OB RESULTS CONSOLE HEPATITIS B SURFACE ANTIGEN: Hepatitis B Surface Ag: NEGATIVE

## 2020-11-19 LAB — OB RESULTS CONSOLE RPR: RPR: NONREACTIVE

## 2020-11-19 LAB — OB RESULTS CONSOLE HIV ANTIBODY (ROUTINE TESTING): HIV: NONREACTIVE

## 2020-11-30 ENCOUNTER — Other Ambulatory Visit: Payer: Self-pay | Admitting: Obstetrics and Gynecology

## 2020-11-30 ENCOUNTER — Ambulatory Visit
Admission: RE | Admit: 2020-11-30 | Discharge: 2020-11-30 | Disposition: A | Discharge status: Home / Self Care | Payer: BC Managed Care – PPO | Source: Ambulatory Visit | Attending: Obstetrics and Gynecology | Admitting: Obstetrics and Gynecology

## 2020-11-30 ENCOUNTER — Other Ambulatory Visit: Payer: Self-pay

## 2020-11-30 DIAGNOSIS — O26841 Uterine size-date discrepancy, first trimester: Secondary | ICD-10-CM

## 2020-12-04 NOTE — L&D Delivery Note (Signed)
Delivery Note Patient underwent SROM at 1219.  She quickly progressed in active labor.  I was called at 1431 by RN stating baby was crowning.  Upon my arrival to the room < 1 minute later, baby was on mom's chest and RN was clamping cord. Per RN, patient did not push until head fully out.  At 2:32 PM a viable female was delivered via Vaginal, Spontaneous (Presentation: Right Occiput Anterior).  APGAR: 8, 9; weight 2980 gm (6lb 9.1oz).   Placenta status: Spontaneous, Intact.  Cord: 3 vessels with the following complications: None.  Cord pH: n/a  Anesthesia: Epidural Episiotomy: None Lacerations: None Suture Repair:  n/a Est. Blood Loss (mL): 260  Mom to postpartum.  Baby to Couplet care / Skin to Skin.  Kathryn Carr Clois Treanor 06/02/2021, 3:52 PM

## 2021-01-19 ENCOUNTER — Encounter: Payer: Self-pay | Admitting: *Deleted

## 2021-01-24 ENCOUNTER — Ambulatory Visit: Payer: BC Managed Care – PPO

## 2021-02-01 ENCOUNTER — Telehealth: Payer: Self-pay

## 2021-02-01 NOTE — Telephone Encounter (Signed)
02/01/21 called OB Office and LVM for them to fax over the referral for this patient to have her anatomy ultrasound in our office.

## 2021-02-02 ENCOUNTER — Other Ambulatory Visit: Payer: Self-pay | Admitting: Obstetrics and Gynecology

## 2021-02-02 DIAGNOSIS — Z363 Encounter for antenatal screening for malformations: Secondary | ICD-10-CM

## 2021-02-02 DIAGNOSIS — Z3A20 20 weeks gestation of pregnancy: Secondary | ICD-10-CM

## 2021-02-03 ENCOUNTER — Other Ambulatory Visit: Payer: Self-pay | Admitting: Obstetrics and Gynecology

## 2021-02-03 ENCOUNTER — Ambulatory Visit: Payer: BC Managed Care – PPO | Attending: Obstetrics and Gynecology

## 2021-02-03 ENCOUNTER — Other Ambulatory Visit: Payer: Self-pay

## 2021-02-03 DIAGNOSIS — Z363 Encounter for antenatal screening for malformations: Secondary | ICD-10-CM

## 2021-02-03 DIAGNOSIS — Z3A20 20 weeks gestation of pregnancy: Secondary | ICD-10-CM | POA: Diagnosis not present

## 2021-02-04 ENCOUNTER — Other Ambulatory Visit: Payer: Self-pay | Admitting: *Deleted

## 2021-02-04 DIAGNOSIS — O35EXX0 Maternal care for other (suspected) fetal abnormality and damage, fetal genitourinary anomalies, not applicable or unspecified: Secondary | ICD-10-CM

## 2021-02-04 DIAGNOSIS — O358XX0 Maternal care for other (suspected) fetal abnormality and damage, not applicable or unspecified: Secondary | ICD-10-CM

## 2021-03-08 IMAGING — US US OB COMP LESS 14 WK
1 series · 15 of 28 positions shown · non-contrast
Comparison: None.

CLINICAL DATA: Pregnancy of uncertain dates

EXAM:
OBSTETRIC <14 WK ULTRASOUND
TECHNIQUE: Transabdominal ultrasound was performed for evaluation of the
gestation as well as the maternal uterus and adnexal regions.

[Series 1: us ob comp less 14 wk · 41 acquisitions, 15 frames shown]
[im 1/41]
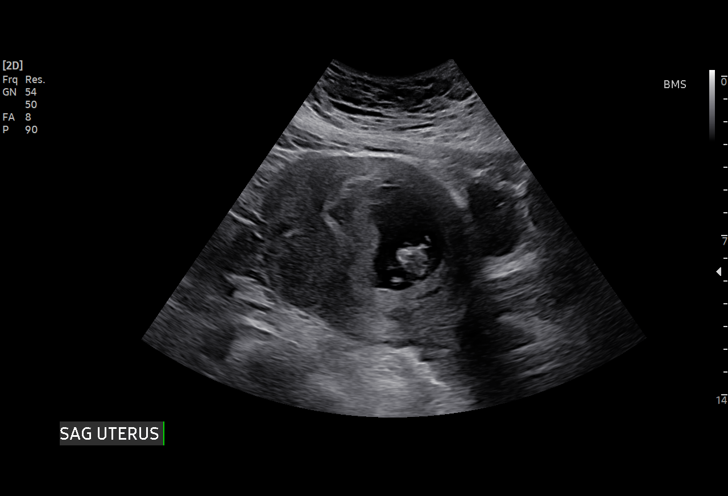
[im 3/41]
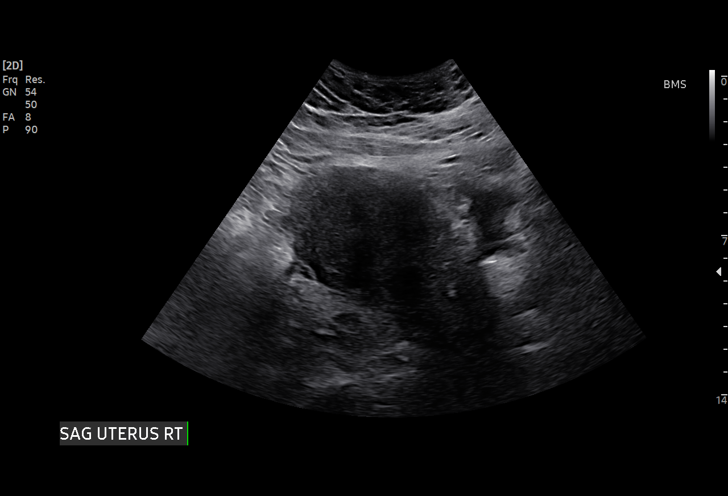
[im 6/41]
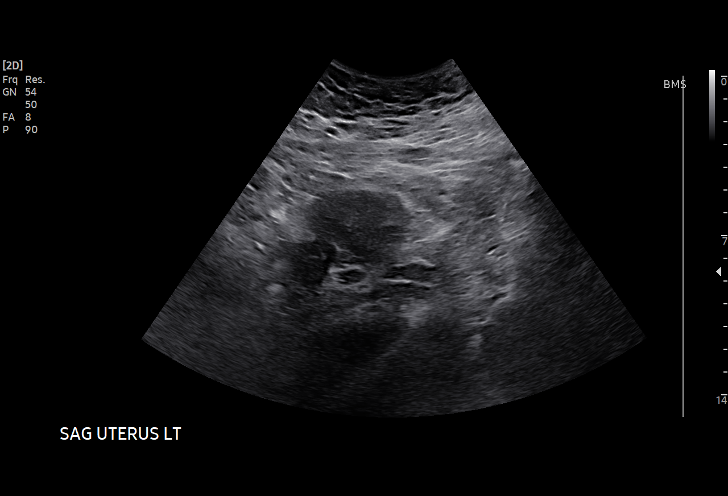
[im 9/41]
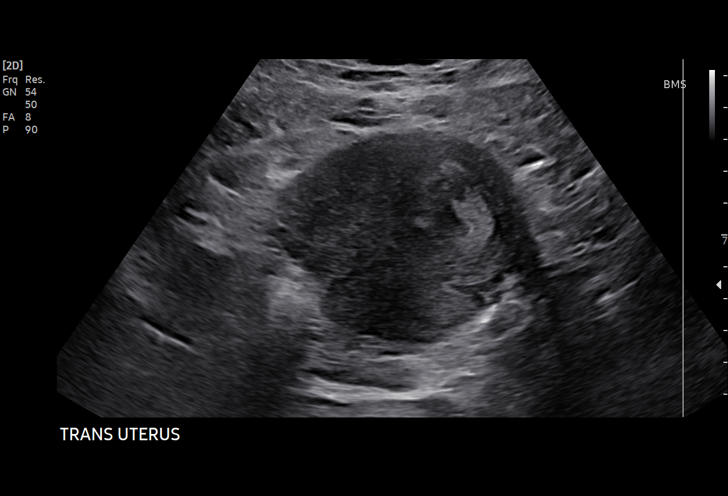
[im 12/41]
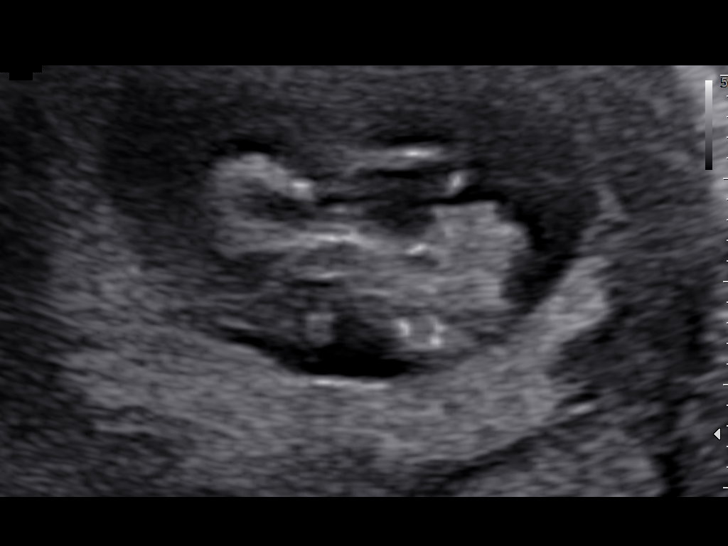
[im 15/41]
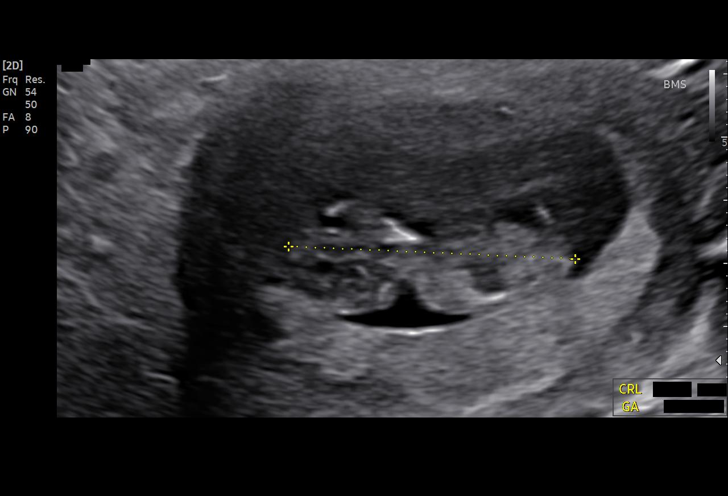
[im 18/41]
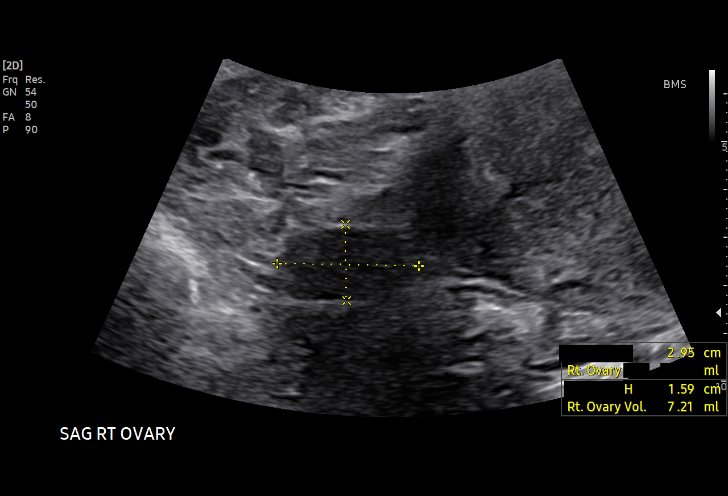
[im 21/41]
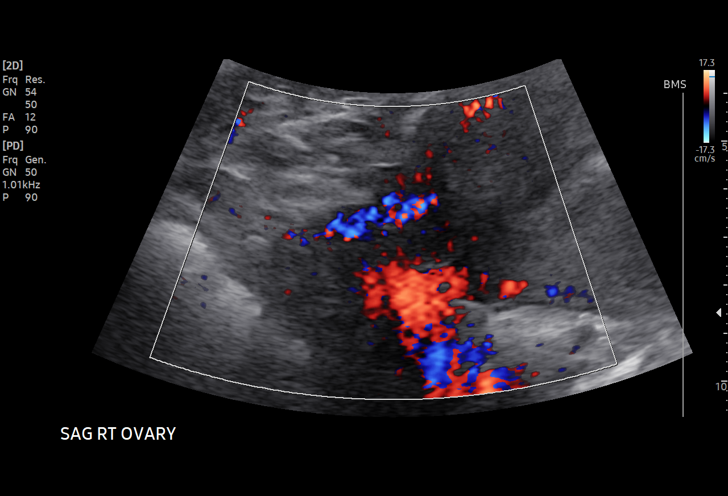
[im 23/41]
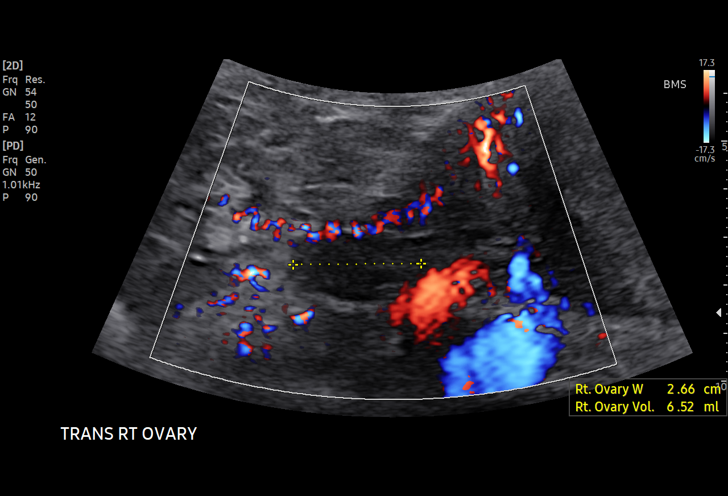
[im 26/41]
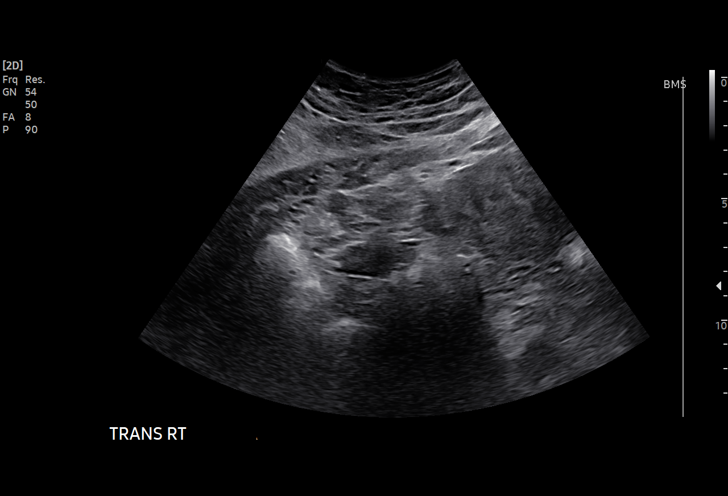
[im 29/41]
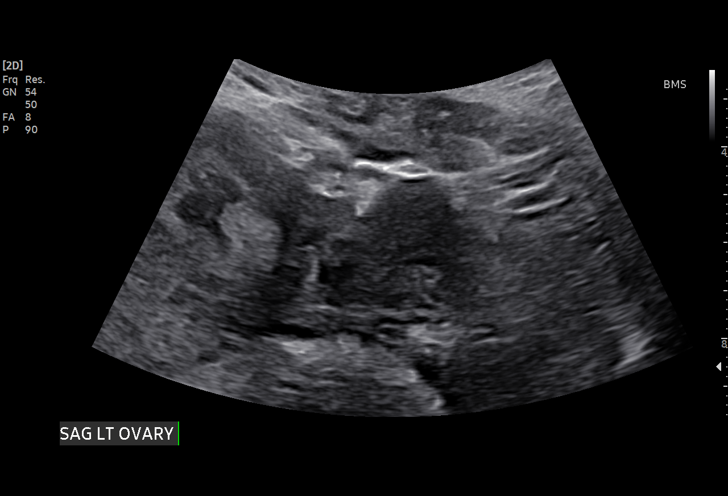
[im 32/41]
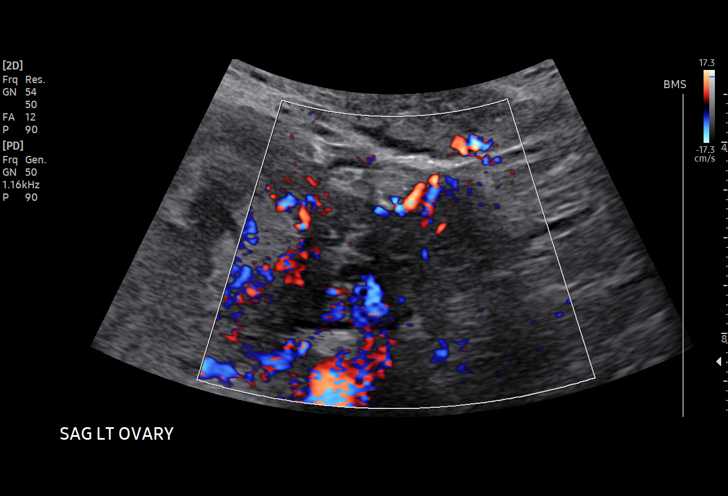
[im 35/41]
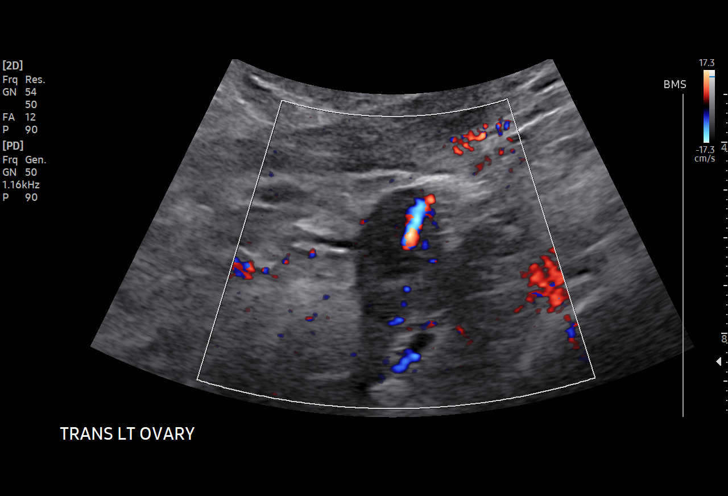
[im 38/41]
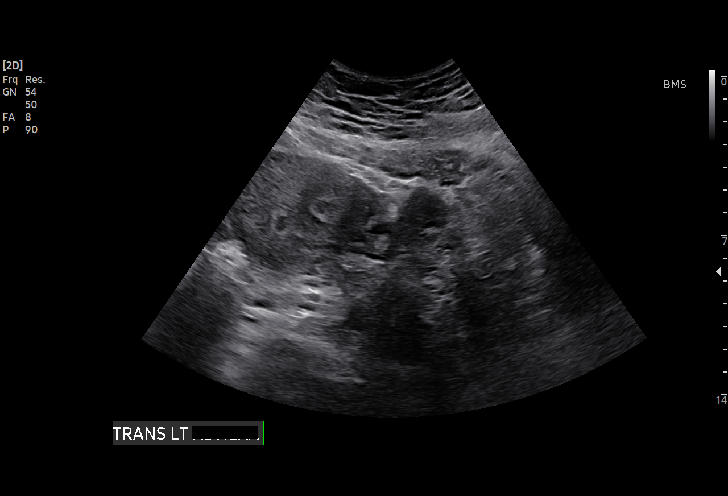
[im 41/41]
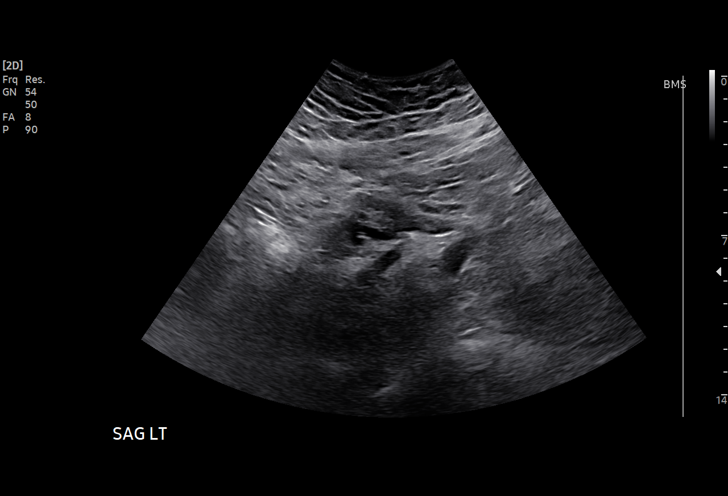

[15 of 28 positions shown; findings below may reference images not displayed]

FINDINGS: Intrauterine gestational sac: Single

Yolk sac:  Not Visualized.

Embryo:  Visualized.

Cardiac Activity: Visualized.

Heart Rate: 158 bpm

MSD:    mm    w     d

CRL:   47.5 mm   11 w 4 d                  US EDC: 06/17/2021

Subchorionic hemorrhage:  None visualized.

Maternal uterus/adnexae: No adnexal mass or free fluid.
IMPRESSION: Eleven week 4 day intrauterine pregnancy. Fetal heart rate 150 beats
per minute. No acute maternal findings.

## 2021-03-31 ENCOUNTER — Ambulatory Visit: Payer: Managed Care, Other (non HMO) | Attending: Obstetrics and Gynecology

## 2021-03-31 ENCOUNTER — Encounter: Payer: Self-pay | Admitting: *Deleted

## 2021-03-31 ENCOUNTER — Other Ambulatory Visit: Payer: Self-pay

## 2021-03-31 ENCOUNTER — Ambulatory Visit: Payer: Managed Care, Other (non HMO) | Admitting: *Deleted

## 2021-03-31 VITALS — BP 123/51 | HR 67

## 2021-03-31 DIAGNOSIS — O358XX0 Maternal care for other (suspected) fetal abnormality and damage, not applicable or unspecified: Secondary | ICD-10-CM | POA: Diagnosis present

## 2021-03-31 DIAGNOSIS — Z6841 Body Mass Index (BMI) 40.0 and over, adult: Secondary | ICD-10-CM | POA: Insufficient documentation

## 2021-03-31 DIAGNOSIS — O35EXX0 Maternal care for other (suspected) fetal abnormality and damage, fetal genitourinary anomalies, not applicable or unspecified: Secondary | ICD-10-CM

## 2021-03-31 DIAGNOSIS — O99213 Obesity complicating pregnancy, third trimester: Secondary | ICD-10-CM

## 2021-03-31 DIAGNOSIS — Z3A28 28 weeks gestation of pregnancy: Secondary | ICD-10-CM | POA: Diagnosis not present

## 2021-04-01 ENCOUNTER — Other Ambulatory Visit: Payer: Self-pay | Admitting: *Deleted

## 2021-04-01 DIAGNOSIS — O358XX0 Maternal care for other (suspected) fetal abnormality and damage, not applicable or unspecified: Secondary | ICD-10-CM

## 2021-04-01 DIAGNOSIS — O35EXX0 Maternal care for other (suspected) fetal abnormality and damage, fetal genitourinary anomalies, not applicable or unspecified: Secondary | ICD-10-CM

## 2021-04-28 ENCOUNTER — Ambulatory Visit: Payer: Managed Care, Other (non HMO) | Admitting: *Deleted

## 2021-04-28 ENCOUNTER — Ambulatory Visit: Payer: Managed Care, Other (non HMO) | Attending: Obstetrics and Gynecology

## 2021-04-28 ENCOUNTER — Other Ambulatory Visit: Payer: Self-pay | Admitting: Obstetrics

## 2021-04-28 ENCOUNTER — Other Ambulatory Visit: Payer: Self-pay

## 2021-04-28 ENCOUNTER — Encounter: Payer: Self-pay | Admitting: *Deleted

## 2021-04-28 VITALS — BP 124/53 | HR 75

## 2021-04-28 DIAGNOSIS — Z6841 Body Mass Index (BMI) 40.0 and over, adult: Secondary | ICD-10-CM

## 2021-04-28 DIAGNOSIS — O99213 Obesity complicating pregnancy, third trimester: Secondary | ICD-10-CM

## 2021-04-28 DIAGNOSIS — O358XX0 Maternal care for other (suspected) fetal abnormality and damage, not applicable or unspecified: Secondary | ICD-10-CM | POA: Insufficient documentation

## 2021-04-28 DIAGNOSIS — Z3A32 32 weeks gestation of pregnancy: Secondary | ICD-10-CM | POA: Diagnosis not present

## 2021-04-28 DIAGNOSIS — O35EXX0 Maternal care for other (suspected) fetal abnormality and damage, fetal genitourinary anomalies, not applicable or unspecified: Secondary | ICD-10-CM

## 2021-05-24 LAB — OB RESULTS CONSOLE GBS: GBS: NEGATIVE

## 2021-06-01 ENCOUNTER — Inpatient Hospital Stay (HOSPITAL_COMMUNITY)
Admission: AD | Admit: 2021-06-01 | Discharge: 2021-06-03 | DRG: 807 | Disposition: A | Discharge status: Home / Self Care | Payer: Managed Care, Other (non HMO) | Attending: Obstetrics | Admitting: Obstetrics

## 2021-06-01 ENCOUNTER — Encounter (HOSPITAL_COMMUNITY): Payer: Self-pay | Admitting: Obstetrics and Gynecology

## 2021-06-01 ENCOUNTER — Other Ambulatory Visit: Payer: Self-pay

## 2021-06-01 DIAGNOSIS — Z20822 Contact with and (suspected) exposure to covid-19: Secondary | ICD-10-CM | POA: Diagnosis present

## 2021-06-01 DIAGNOSIS — O99214 Obesity complicating childbirth: Secondary | ICD-10-CM | POA: Diagnosis present

## 2021-06-01 DIAGNOSIS — O134 Gestational [pregnancy-induced] hypertension without significant proteinuria, complicating childbirth: Principal | ICD-10-CM | POA: Diagnosis present

## 2021-06-01 DIAGNOSIS — Z3A37 37 weeks gestation of pregnancy: Secondary | ICD-10-CM | POA: Diagnosis not present

## 2021-06-01 DIAGNOSIS — O358XX0 Maternal care for other (suspected) fetal abnormality and damage, not applicable or unspecified: Secondary | ICD-10-CM | POA: Diagnosis present

## 2021-06-01 DIAGNOSIS — Z87891 Personal history of nicotine dependence: Secondary | ICD-10-CM | POA: Diagnosis not present

## 2021-06-01 DIAGNOSIS — O133 Gestational [pregnancy-induced] hypertension without significant proteinuria, third trimester: Secondary | ICD-10-CM

## 2021-06-01 DIAGNOSIS — O139 Gestational [pregnancy-induced] hypertension without significant proteinuria, unspecified trimester: Secondary | ICD-10-CM | POA: Diagnosis present

## 2021-06-01 DIAGNOSIS — R519 Headache, unspecified: Secondary | ICD-10-CM | POA: Diagnosis present

## 2021-06-01 LAB — TYPE AND SCREEN
ABO/RH(D): O POS
Antibody Screen: NEGATIVE

## 2021-06-01 LAB — RESP PANEL BY RT-PCR (FLU A&B, COVID) ARPGX2
Influenza A by PCR: NEGATIVE
Influenza B by PCR: NEGATIVE
SARS Coronavirus 2 by RT PCR: NEGATIVE

## 2021-06-01 LAB — CBC
HCT: 36.9 % (ref 36.0–46.0)
Hemoglobin: 12.4 g/dL (ref 12.0–15.0)
MCH: 30 pg (ref 26.0–34.0)
MCHC: 33.6 g/dL (ref 30.0–36.0)
MCV: 89.3 fL (ref 80.0–100.0)
Platelets: 166 10*3/uL (ref 150–400)
RBC: 4.13 MIL/uL (ref 3.87–5.11)
RDW: 14.2 % (ref 11.5–15.5)
WBC: 9.1 10*3/uL (ref 4.0–10.5)
nRBC: 0 % (ref 0.0–0.2)

## 2021-06-01 LAB — COMPREHENSIVE METABOLIC PANEL
ALT: 11 U/L (ref 0–44)
AST: 16 U/L (ref 15–41)
Albumin: 2.7 g/dL — ABNORMAL LOW (ref 3.5–5.0)
Alkaline Phosphatase: 68 U/L (ref 38–126)
Anion gap: 8 (ref 5–15)
BUN: 5 mg/dL — ABNORMAL LOW (ref 6–20)
CO2: 24 mmol/L (ref 22–32)
Calcium: 9.2 mg/dL (ref 8.9–10.3)
Chloride: 103 mmol/L (ref 98–111)
Creatinine, Ser: 0.57 mg/dL (ref 0.44–1.00)
GFR, Estimated: >60 mL/min (ref >60)
Glucose, Bld: 75 mg/dL (ref 70–99)
Potassium: 3.6 mmol/L (ref 3.5–5.1)
Sodium: 135 mmol/L (ref 135–145)
Total Bilirubin: 0.4 mg/dL (ref 0.3–1.2)
Total Protein: 6.2 g/dL — ABNORMAL LOW (ref 6.5–8.1)

## 2021-06-01 LAB — PROTEIN / CREATININE RATIO, URINE
Creatinine, Urine: 200.28 mg/dL
Protein Creatinine Ratio: 0.07 mg/mg{Cre} (ref 0.00–0.15)
Total Protein, Urine: 15 mg/dL

## 2021-06-01 MED ORDER — FENTANYL CITRATE (PF) 100 MCG/2ML IJ SOLN
50.0000 ug | INTRAMUSCULAR | Status: DC | PRN
Start: 2021-06-01 — End: 2021-06-02

## 2021-06-01 MED ORDER — OXYCODONE-ACETAMINOPHEN 5-325 MG PO TABS: 2.0000 | ORAL_TABLET | ORAL | Status: DC | PRN

## 2021-06-01 MED ORDER — LACTATED RINGERS IV SOLN: 500.0000 mL | INTRAVENOUS | Status: DC | PRN

## 2021-06-01 MED ORDER — MISOPROSTOL 25 MCG QUARTER TABLET
25.0000 ug | ORAL_TABLET | ORAL | Status: DC | PRN
  Administered 2021-06-01 – 2021-06-02 (×3): 25 ug via VAGINAL
  Filled 2021-06-01 (×3): qty 1

## 2021-06-01 MED ORDER — ACETAMINOPHEN 500 MG PO TABS
1000.0000 mg | ORAL_TABLET | Freq: Once | ORAL | Status: AC
  Administered 2021-06-01: 1000 mg via ORAL
  Filled 2021-06-01: qty 2

## 2021-06-01 MED ORDER — OXYCODONE-ACETAMINOPHEN 5-325 MG PO TABS: 1.0000 | ORAL_TABLET | ORAL | Status: DC | PRN

## 2021-06-01 MED ORDER — ACETAMINOPHEN 325 MG PO TABS: 650.0000 mg | ORAL_TABLET | ORAL | Status: DC | PRN

## 2021-06-01 MED ORDER — TERBUTALINE SULFATE 1 MG/ML IJ SOLN: 0.2500 mg | Freq: Once | INTRAMUSCULAR | Status: DC | PRN

## 2021-06-01 MED ORDER — OXYTOCIN BOLUS FROM INFUSION
333.0000 mL | Freq: Once | INTRAVENOUS | Status: AC
  Administered 2021-06-02: 333 mL via INTRAVENOUS

## 2021-06-01 MED ORDER — SOD CITRATE-CITRIC ACID 500-334 MG/5ML PO SOLN: 30.0000 mL | ORAL | Status: DC | PRN

## 2021-06-01 MED ORDER — LIDOCAINE HCL (PF) 1 % IJ SOLN: 30.0000 mL | INTRAMUSCULAR | Status: DC | PRN

## 2021-06-01 MED ORDER — OXYTOCIN-SODIUM CHLORIDE 30-0.9 UT/500ML-% IV SOLN
2.5000 [IU]/h | INTRAVENOUS | Status: DC
  Filled 2021-06-01: qty 500

## 2021-06-01 MED ORDER — ONDANSETRON HCL 4 MG/2ML IJ SOLN
4.0000 mg | Freq: Four times a day (QID) | INTRAMUSCULAR | Status: DC | PRN
  Administered 2021-06-02: 4 mg via INTRAVENOUS
  Filled 2021-06-01: qty 2

## 2021-06-01 MED ORDER — LACTATED RINGERS IV SOLN: INTRAVENOUS | Status: DC

## 2021-06-01 NOTE — H&P (Signed)
Kathryn Carr is a 30 y.o. female G2P1001 at [redacted]w[redacted]d who presented to [facility]U this afternoon after complaint of headache, dizziness, and home Bps of 170s/90s. Reports normal fetal movement, no contractions/LOF/vaginal bleeding.    In MAU, headache had initially resolved and Bps were normal. Labs drawn and normal. During workup, she reported return of her headache and dizziness. Blood pressures were taken at that time which were 155/69 and 142/65.   At this time, she states headache is completely resolved. She denies vision changes and RUQ pain.    Pregnancy c/b: Obesity: pre-pregnancy BMI 47.2, most recent growth on 6/2 was 68% (4lb13oz) Mild bilateral pyelectasis (fetal): noted on 6/2 [place] (0.5-0.7cm) History of preeclampsia during previous pregnancy: has been on aspirin 81mg  during this pregnancy  OB History     Gravida  2   Para  1   Term  1   Preterm      AB      Living  1      SAB      IAB      Ectopic      Multiple  0   Live Births  1          Past Medical History:  Diagnosis Date   Complication of anesthesia    Father wakes up slowly   Past Surgical History:  Procedure Laterality Date   WISDOM TOOTH EXTRACTION     Family History: family history includes Cancer in her mother; Diabetes in her brother, father, maternal grandfather, mother, paternal grandmother, and son; Heart disease in her father, maternal grandfather, and paternal grandmother; Kidney disease in her paternal grandmother; Stroke in her paternal grandfather. Social History:  reports that she quit smoking about 7 years ago. Her smoking use included cigarettes. She has never used smokeless tobacco. She reports that she does not drink alcohol and does not use drugs.     Maternal Diabetes: No Genetic Screening: Normal Maternal Ultrasounds/Referrals: Fetal renal pyelectasis Fetal Ultrasounds or other Referrals:  Referred to Materal Fetal Medicine  followed by MFM for growth scans Maternal  Substance Abuse:  No Significant Maternal Medications:  None Significant Maternal Lab Results:  Group B Strep negative Other Comments:  None  Review of Systems Per HPI Exam Physical Exam  Dilation: Fingertip Effacement (%): 50 Station: -3 Exam by:: Alexis [email or web address] RN Blood pressure (!) 143/78, pulse 82, temperature 98.7 F (37.1 C), temperature source Oral, resp. rate 18, height 5\' 2"  (1.575 m), weight 128.5 kg, last menstrual period 09/12/2020, SpO2 99 %, unknown if currently breastfeeding.  Gen: NAD, resting comfortably CVS: RRR Lungs: nonlabored respirations Gravid abdomen, obese Ext: no calf edema or tenderness SVE: 0.5/50/-3 by RN Vtx presentation confirmed on 11/12/2020 by MAU provider  Fetal testing: 130bpm, moderate variability, + accels, no decels Toco: acontractile  Prenatal labs: ABO, Rh:  --/--/O POS (06/29 1719) Antibody: NEG (06/29 1719) Rubella: Immune (12/17 0000) RPR: Nonreactive (12/17 0000)  HBsAg: Negative (12/17 0000)  HIV: Non-reactive (12/17 0000)  GBS: Negative/-- (06/21 0000)   Assessment/Plan: 30Y G2P1001 @ [redacted]w[redacted]d with elevated Bps on home BP cuff and here in MAU, will admit for at least gestational hypertension and proceed with IOL Gestational hypertension: urine p/c pending.  Headache currently resolved. Will monitor for severe features or blood pressures.  IOL: cytotec 03-01-1997 q4hr PRN cervical ripening Fetal wellbeing: category I tracing Obesity: current BMI 51    [redacted]w[redacted]d 06/01/2021, 6:54 PM

## 2021-06-01 NOTE — MAU Note (Signed)
Presents with c/o elevated BP @ home last night and this morning.  Reports BP's @ home 170/100 and 177/102.  Endorses H/A, denies current visual disturbances and epigastric pain.  States had blurred vision and floaters earlier today.  Denies VB or LOF.  Endorses +FM, less than usual.

## 2021-06-01 NOTE — MAU Provider Note (Signed)
Chief Complaint:  BP Evaluation   Event Date/Time   First Provider Initiated Contact with Patient 06/01/21 1454      HPI: Kathryn Carr is a 30 y.o. G2P1001 at [redacted]w[redacted]d with hx significant for preeclampsia in previous pregnancy who presents to maternity admissions reporting episodes of h/a and dizziness at home starting yesterday with BP 170s/90s on home cuff.  She also reports less fetal movement today than usual.  She is feeling movement in MAU.  She has not tried any treatments. There are no other associated symptoms.    HPI  Past Medical History: Past Medical History:  Diagnosis Date   Complication of anesthesia    Father wakes up slowly    Past obstetric history: OB History  Gravida Para Term Preterm AB Living  2 1 1     1   SAB IAB Ectopic Multiple Live Births        0 1    # Outcome Date GA Lbr Len/2nd Weight Sex Delivery Anes PTL Lv  2 Current           1 Term 09/04/15 [redacted]w[redacted]d 08:37 / 03:21 3657 g M Vag-Spont EPI  LIV     Birth Comments: wnl    Past Surgical History: Past Surgical History:  Procedure Laterality Date   WISDOM TOOTH EXTRACTION      Family History: Family History  Problem Relation Age of Onset   Cancer Mother    Diabetes Mother    Heart disease Father    Diabetes Father    Diabetes Brother    Diabetes Son    Heart disease Maternal Grandfather    Diabetes Maternal Grandfather    Diabetes Paternal Grandmother    Kidney disease Paternal Grandmother    Heart disease Paternal Grandmother    Stroke Paternal Grandfather     Social History: Social History   Tobacco Use   Smoking status: Former    Pack years: 0.00    Types: Cigarettes    Quit date: 03/20/2014    Years since quitting: 7.2   Smokeless tobacco: Never  Vaping Use   Vaping Use: Never used  Substance Use Topics   Alcohol use: No    Alcohol/week: 0.0 standard drinks   Drug use: No    Allergies:  Allergies  Allergen Reactions   Penicillins Hives    Has patient had a PCN  reaction causing immediate rash, facial/tongue/throat swelling, SOB or lightheadedness with hypotension: Yes Has patient had a PCN reaction causing severe rash involving mucus membranes or skin necrosis: Yes Has patient had a PCN reaction that required hospitalization No Has patient had a PCN reaction occurring within the last 10 years: No If all of the above answers are "NO", then may proceed with Cephalosporin use.    Tape     Clear adhesive tape     Meds:  Medications Prior to Admission  Medication Sig Dispense Refill Last Dose   aspirin EC 81 MG tablet Take 81 mg by mouth daily. Swallow whole.      Fe Fum-FePoly-FA-Vit C-Vit B3 (INTEGRA F) 125-1 MG CAPS Take 1 capsule by mouth daily. (Patient not taking: No sig reported) 30 capsule 3    ferrous sulfate 325 (65 FE) MG tablet Take 325 mg by mouth daily with breakfast. (Patient not taking: Reported on 03/31/2021)      hydrOXYzine (ATARAX/VISTARIL) 25 MG tablet Take 0.5-1 tablets (12.5-25 mg total) by mouth every 8 (eight) hours as needed for itching. (Patient not taking: Reported  on 03/31/2021) 30 tablet 0    meclizine (ANTIVERT) 12.5 MG tablet Take 1 tablet (12.5 mg total) by mouth 3 (three) times daily as needed for dizziness. (Patient not taking: Reported on 03/31/2021) 30 tablet 0    Prenatal Vit-Fe Fumarate-FA (PRENATAL MULTIVITAMIN) TABS tablet Take 1 tablet by mouth daily at 12 noon.       ROS:  Review of Systems   I have reviewed patient's Past Medical Hx, Surgical Hx, Family Hx, Social Hx, medications and allergies.   Physical Exam  Patient Vitals for the past 24 hrs:  BP Temp Temp src Pulse Resp SpO2 Height Weight  06/01/21 1645 137/71 -- -- 94 -- 99 % -- --  06/01/21 1631 (!) 142/65 -- -- 93 -- 100 % -- --  06/01/21 1626 (!) 155/69 -- -- 91 -- 100 % -- --  06/01/21 1500 137/65 -- -- 80 -- 100 % -- --  06/01/21 1445 125/77 -- -- 92 -- 98 % -- --  06/01/21 1430 119/69 -- -- 85 -- 98 % -- --  06/01/21 1415 130/69 -- -- 82  -- 99 % -- --  06/01/21 1400 127/71 -- -- 89 -- 99 % -- --  06/01/21 1345 131/71 -- -- 88 -- 100 % -- --  06/01/21 1330 139/72 98.7 F (37.1 C) Oral 91 20 99 % -- --  06/01/21 1322 -- -- -- -- -- -- 5\' 2"  (1.575 m) 128.5 kg   Constitutional: Well-developed, well-nourished female in no acute distress.  Cardiovascular: normal rate Respiratory: normal effort GI: Abd soft, non-tender, gravid appropriate for gestational age.  MS: Extremities nontender, no edema, normal ROM Neurologic: Alert and oriented x 4.  GU: Neg CVAT.  PELVIC EXAM: Cervix pink, visually closed, without lesion, scant white creamy discharge, vaginal walls and external genitalia normal Bimanual exam: Cervix 0/long/high, firm, anterior, neg CMT, uterus nontender, nonenlarged, adnexa without tenderness, enlargement, or mass     FHT:  Baseline 145 , moderate variability, accelerations present, no decelerations Contractions:  rare on toco or to palpation   Labs: Results for orders placed or performed during the hospital encounter of 06/01/21 (from the past 24 hour(s))  CBC     Status: None   Collection Time: 06/01/21  3:05 PM  Result Value Ref Range   WBC 9.1 4.0 - 10.5 K/uL   RBC 4.13 3.87 - 5.11 MIL/uL   Hemoglobin 12.4 12.0 - 15.0 g/dL   HCT 06/03/21 40.9 - 73.5 %   MCV 89.3 80.0 - 100.0 fL   MCH 30.0 26.0 - 34.0 pg   MCHC 33.6 30.0 - 36.0 g/dL   RDW 32.9 92.4 - 26.8 %   Platelets 166 150 - 400 K/uL   nRBC 0.0 0.0 - 0.2 %  Comprehensive metabolic panel     Status: Abnormal   Collection Time: 06/01/21  3:05 PM  Result Value Ref Range   Sodium 135 135 - 145 mmol/L   Potassium 3.6 3.5 - 5.1 mmol/L   Chloride 103 98 - 111 mmol/L   CO2 24 22 - 32 mmol/L   Glucose, Bld 75 70 - 99 mg/dL   BUN 5 (L) 6 - 20 mg/dL   Creatinine, Ser 06/03/21 0.44 - 1.00 mg/dL   Calcium 9.2 8.9 - 9.62 mg/dL   Total Protein 6.2 (L) 6.5 - 8.1 g/dL   Albumin 2.7 (L) 3.5 - 5.0 g/dL   AST 16 15 - 41 U/L   ALT 11 0 - 44 U/L  Alkaline  Phosphatase 68 38 - 126 U/L   Total Bilirubin 0.4 0.3 - 1.2 mg/dL   GFR, Estimated >76 >19 mL/min   Anion gap 8 5 - 15      Imaging:  No results found.  MAU Course/MDM: Orders Placed This Encounter  Procedures   CBC   Comprehensive metabolic panel   SCDs   Cervical Exam   SCDs    Meds ordered this encounter  Medications   acetaminophen (TYLENOL) tablet 1,000 mg     NST reviewed and reactive Pt feeling normal fetal movement in MAU Pt with normal BPs x 6 in MAU.  Pt with improved h/a without treatment but still 2-3/10.  No dizziness in MAU but pt reports concerns about this symptom at home.   [facility] called prior to pt presenting to MAU so contacted [facility] with update that pt is normotensive Will do labs, CBC, CMP and give Tylenol 1000 mg PO x 1 dose in MAU  After Tylenol, pt headache and dizziness returned.  BP 140s-150s/60s with headache.  Given HTN at home and BP today, called Dr Timothy Lasso who will admit patient for IOL for GHTN.  Vertex position confirmed by bedside US  Limited OB US Date: 06/01/21 EDD : 09/12/20 based on LMP, c/w early [place] Viability:  FHT detected.  Amniotic fluid subjectively normal Fetal position: Vertex noted with cranial features identified in pelvis on today's [place]   Pt informed that the ultrasound is considered a limited OB ultrasound and is not intended to be a complete ultrasound exam.  Patient also informed that the ultrasound is not being completed with the intent of assessing for fetal or placental anomalies or any pelvic abnormalities.  Explained that the purpose of today's ultrasound is to assess for  presentation.  Patient acknowledges the purpose of the exam and the limitations of the study.     Assessment: 1. Gestational hypertension, third trimester   2. [redacted] weeks gestation of pregnancy     Plan: Admit to L&D for IOL Dr Timothy Lasso to place orders and assume care   Sharen Counter Certified Nurse-Midwife 06/01/2021 4:56 PM

## 2021-06-02 ENCOUNTER — Inpatient Hospital Stay (HOSPITAL_COMMUNITY): Payer: Managed Care, Other (non HMO) | Admitting: Anesthesiology

## 2021-06-02 ENCOUNTER — Encounter (HOSPITAL_COMMUNITY): Payer: Self-pay | Admitting: Obstetrics and Gynecology

## 2021-06-02 ENCOUNTER — Ambulatory Visit: Payer: BC Managed Care – PPO

## 2021-06-02 LAB — RPR: RPR Ser Ql: NONREACTIVE

## 2021-06-02 LAB — CBC
HCT: 39 % (ref 36.0–46.0)
Hemoglobin: 13.1 g/dL (ref 12.0–15.0)
MCH: 29.9 pg (ref 26.0–34.0)
MCHC: 33.6 g/dL (ref 30.0–36.0)
MCV: 89 fL (ref 80.0–100.0)
Platelets: 183 10*3/uL (ref 150–400)
RBC: 4.38 MIL/uL (ref 3.87–5.11)
RDW: 14.2 % (ref 11.5–15.5)
WBC: 13.7 10*3/uL — ABNORMAL HIGH (ref 4.0–10.5)
nRBC: 0 % (ref 0.0–0.2)

## 2021-06-02 MED ORDER — EPHEDRINE 5 MG/ML INJ: 10.0000 mg | INTRAVENOUS | Status: DC | PRN

## 2021-06-02 MED ORDER — COCONUT OIL OIL: 1.0000 "application " | TOPICAL_OIL | Status: DC | PRN

## 2021-06-02 MED ORDER — DIBUCAINE (PERIANAL) 1 % EX OINT: 1.0000 "application " | TOPICAL_OINTMENT | CUTANEOUS | Status: DC | PRN

## 2021-06-02 MED ORDER — BENZOCAINE-MENTHOL 20-0.5 % EX AERO: 1.0000 "application " | INHALATION_SPRAY | CUTANEOUS | Status: DC | PRN

## 2021-06-02 MED ORDER — ACETAMINOPHEN 325 MG PO TABS: 650.0000 mg | ORAL_TABLET | ORAL | Status: DC | PRN

## 2021-06-02 MED ORDER — OXYCODONE HCL 5 MG PO TABS
10.0000 mg | ORAL_TABLET | ORAL | Status: DC | PRN
Start: 2021-06-02 — End: 2021-06-03

## 2021-06-02 MED ORDER — TETANUS-DIPHTH-ACELL PERTUSSIS 5-2.5-18.5 LF-MCG/0.5 IM SUSY: 0.5000 mL | PREFILLED_SYRINGE | Freq: Once | INTRAMUSCULAR | Status: DC

## 2021-06-02 MED ORDER — DIPHENHYDRAMINE HCL 25 MG PO CAPS: 25.0000 mg | ORAL_CAPSULE | Freq: Four times a day (QID) | ORAL | Status: DC | PRN

## 2021-06-02 MED ORDER — PHENYLEPHRINE 40 MCG/ML (10ML) SYRINGE FOR IV PUSH (FOR BLOOD PRESSURE SUPPORT): 80.0000 ug | PREFILLED_SYRINGE | INTRAVENOUS | Status: DC | PRN

## 2021-06-02 MED ORDER — WITCH HAZEL-GLYCERIN EX PADS: 1.0000 "application " | MEDICATED_PAD | CUTANEOUS | Status: DC | PRN

## 2021-06-02 MED ORDER — IBUPROFEN 600 MG PO TABS
600.0000 mg | ORAL_TABLET | Freq: Four times a day (QID) | ORAL | Status: DC
  Administered 2021-06-02 – 2021-06-03 (×3): 600 mg via ORAL
  Filled 2021-06-02 (×4): qty 1

## 2021-06-02 MED ORDER — ONDANSETRON HCL 4 MG PO TABS: 4.0000 mg | ORAL_TABLET | ORAL | Status: DC | PRN

## 2021-06-02 MED ORDER — OXYCODONE HCL 5 MG PO TABS
5.0000 mg | ORAL_TABLET | ORAL | Status: DC | PRN
  Administered 2021-06-03: 5 mg via ORAL
  Filled 2021-06-02: qty 1

## 2021-06-02 MED ORDER — LACTATED RINGERS IV SOLN: 500.0000 mL | Freq: Once | INTRAVENOUS | Status: DC

## 2021-06-02 MED ORDER — OXYTOCIN-SODIUM CHLORIDE 30-0.9 UT/500ML-% IV SOLN
1.0000 m[IU]/min | INTRAVENOUS | Status: DC
  Administered 2021-06-02: 1 m[IU]/min via INTRAVENOUS

## 2021-06-02 MED ORDER — FENTANYL-BUPIVACAINE-NACL 0.5-0.125-0.9 MG/250ML-% EP SOLN
12.0000 mL/h | EPIDURAL | Status: DC | PRN
  Administered 2021-06-02: 12 mL/h via EPIDURAL
  Filled 2021-06-02: qty 250

## 2021-06-02 MED ORDER — DIPHENHYDRAMINE HCL 50 MG/ML IJ SOLN: 12.5000 mg | INTRAMUSCULAR | Status: DC | PRN

## 2021-06-02 MED ORDER — SIMETHICONE 80 MG PO CHEW: 80.0000 mg | CHEWABLE_TABLET | ORAL | Status: DC | PRN

## 2021-06-02 MED ORDER — SENNOSIDES-DOCUSATE SODIUM 8.6-50 MG PO TABS
2.0000 | ORAL_TABLET | ORAL | Status: DC
  Administered 2021-06-02: 2 via ORAL
  Filled 2021-06-02: qty 2

## 2021-06-02 MED ORDER — ONDANSETRON HCL 4 MG/2ML IJ SOLN: 4.0000 mg | INTRAMUSCULAR | Status: DC | PRN

## 2021-06-02 MED ORDER — PRENATAL MULTIVITAMIN CH
1.0000 | ORAL_TABLET | Freq: Every day | ORAL | Status: DC
  Administered 2021-06-03: 1 via ORAL
  Filled 2021-06-02: qty 1

## 2021-06-02 MED ORDER — LIDOCAINE HCL (PF) 1 % IJ SOLN
INTRAMUSCULAR | Status: DC | PRN
  Administered 2021-06-02: 10 mL via EPIDURAL

## 2021-06-02 NOTE — Progress Notes (Signed)
Patient comfortable with epidural.  Minimal bleeding since episode.    BP (!) 139/55   Pulse 80   Temp 98 F (36.7 C) (Oral)   Resp 18   Ht 5\' 2"  (1.575 m)   Wt 128.5 kg   LMP 09/12/2020   SpO2 99%   BMI 51.82 kg/m    Toco: q2-3 minutes EFM: 130s, moderate variability, category 1 SVE:  6/70/-2; with contraction mid-exam, SROM occurred with clear fluid.  Small nickel sized clot noted.   A/P:  G2P1 @ [redacted]w[redacted]d with IOL for GHTN GHTN: bps mild range IOL continue pitocin

## 2021-06-02 NOTE — Plan of Care (Signed)
Taje Tondreau, RN 

## 2021-06-02 NOTE — Progress Notes (Signed)
Patient seen and examined.  Admitted overnight for GHTN.  Had a mild headache in MAU.  Has not returned overnight.  BPs have been intermittently mild range. She received 3 doses of misoprostol overnight  BP (!) 145/79   Pulse 76   Temp 98.5 F (36.9 C) (Oral)   Resp 16   Ht 5\' 2"  (1.575 m)   Wt 128.5 kg   LMP 09/12/2020   SpO2 99%   BMI 51.82 kg/m   Toco: q3 minutes EFM: 140s, moderate variability, category 1 SVE: 2/50/-3/anterior / soft  Foley bulb placed and filled with 60 mL fluid  A/P:  G2P1 @[redacted]w[redacted]d  with IOL for GHTN Foley balloon placed, will start low dose pitocin GHTN:  BPs remain mild range, asymptomatic at this time.  Will monitor closely for s/sx of severe disease GBS neg

## 2021-06-02 NOTE — Anesthesia Preprocedure Evaluation (Signed)
Anesthesia Evaluation  Patient identified by MRN, date of birth, ID band Patient awake    Reviewed: Allergy & Precautions, H&P , NPO status , Patient's Chart, lab work & pertinent test results  History of Anesthesia Complications Negative for: history of anesthetic complications  Airway Mallampati: II  TM Distance: >3 FB     Dental   Pulmonary neg pulmonary ROS, former smoker,    Pulmonary exam normal        Cardiovascular negative cardio ROS   Rhythm:regular Rate:Normal     Neuro/Psych negative neurological ROS  negative psych ROS   GI/Hepatic negative GI ROS, Neg liver ROS,   Endo/Other  Morbid obesity  Renal/GU      Musculoskeletal   Abdominal   Peds  Hematology negative hematology ROS (+)   Anesthesia Other Findings   Reproductive/Obstetrics (+) Pregnancy                             Anesthesia Physical Anesthesia Plan  ASA: 3  Anesthesia Plan: Epidural   Post-op Pain Management:    Induction:   PONV Risk Score and Plan:   Airway Management Planned:   Additional Equipment:   Intra-op Plan:   Post-operative Plan:   Informed Consent: I have reviewed the patients History and Physical, chart, labs and discussed the procedure including the risks, benefits and alternatives for the proposed anesthesia with the patient or authorized representative who has indicated his/her understanding and acceptance.       Plan Discussed with:   Anesthesia Plan Comments:         Anesthesia Quick Evaluation

## 2021-06-02 NOTE — Lactation Note (Signed)
This note was copied from a baby's chart. Lactation Consultation Note  Patient Name: Kathryn Carr [id number]'J Date: 06/02/2021 Reason for consult: L&D Initial assessment;Early term 37-38.6wks Age:30 hours  L&D consult with 70 minutes old infant and P2 mother. Parents are present at time of consult. Congratulated them on their newborn. Newborn is skin to skin prone on mother's chest. Discussed STS as ideal transition for infants after birth helping with temperature, blood sugar and comfort. Talked about primal reflexes such as rooting, hands to mouth, searching for the breast among others.   Several unsuccessful attempts to latch, LC assisted with HE.Per mother, newborn was delivered quickly and seems to have a lot of fluid. Explained LC services availability during postpartum stay. Thanked family for their time.    Maternal Data Has patient been taught Hand Expression?: Yes Does the patient have breastfeeding experience prior to this delivery?: Yes How long did the patient breastfeed?: 3 months  Feeding Mother's Current Feeding Choice: Breast Milk  LATCH Score Latch: Too sleepy or reluctant, no latch achieved, no sucking elicited.  Audible Swallowing: None  Type of Nipple: Everted at rest and after stimulation  Comfort (Breast/Nipple): Soft / non-tender  Hold (Positioning): Assistance needed to correctly position infant at breast and maintain latch.  LATCH Score: 5   Interventions Interventions: Assisted with latch;Skin to skin;Hand express;Breast massage;Education;Expressed milk  Discharge WIC Program: No  Consult Status Consult Status: Follow-up Date: 06/02/21 Follow-up type: In-patient    Brennden Masten A Higuera Ancidey 06/02/2021, 3:34 PM

## 2021-06-02 NOTE — Anesthesia Procedure Notes (Signed)
Epidural Patient location during procedure: OB Start time: 06/02/2021 10:31 AM End time: 06/02/2021 10:43 AM  Staffing Anesthesiologist: Lucretia Kern, MD Performed: anesthesiologist   Preanesthetic Checklist Completed: patient identified, IV checked, risks and benefits discussed, monitors and equipment checked, pre-op evaluation and timeout performed  Epidural Patient position: sitting Prep: DuraPrep Patient monitoring: heart rate, continuous pulse ox and blood pressure Approach: midline Location: L3-L4 Injection technique: LOR air  Needle:  Needle type: Tuohy  Needle gauge: 17 G Needle length: 9 cm Needle insertion depth: 8 cm Catheter type: closed end flexible Catheter size: 19 Gauge Catheter at skin depth: 13 cm Test dose: negative  Assessment Events: blood not aspirated, injection not painful, no injection resistance, no paresthesia and negative IV test  Additional Notes Reason for block:procedure for pain

## 2021-06-02 NOTE — Progress Notes (Signed)
CTBS by RN at [phone].  Patient was on birthing ball and stood up and noted heavy vaginal bleeding.  Per RN, on exam, foley bulb was in vagina and removed.  Upon my arrival noticed blood on floor and a clot at perineum.  Unable to obtain QBL, but estimate to be 75-100 mL.   SVE 5/50/-3 / soft / anterior.  No clot appreciated at cervix or after exam.  Fetal status remained category 1.  Ddx includes cervical change, trauma from foley bulb, partial placenta abruption.   Given category 1 status, will continue pitocin and monitor closely.

## 2021-06-03 LAB — CBC
HCT: 34.1 % — ABNORMAL LOW (ref 36.0–46.0)
Hemoglobin: 11.3 g/dL — ABNORMAL LOW (ref 12.0–15.0)
MCH: 30.3 pg (ref 26.0–34.0)
MCHC: 33.1 g/dL (ref 30.0–36.0)
MCV: 91.4 fL (ref 80.0–100.0)
Platelets: 170 10*3/uL (ref 150–400)
RBC: 3.73 MIL/uL — ABNORMAL LOW (ref 3.87–5.11)
RDW: 14.5 % (ref 11.5–15.5)
WBC: 16.2 10*3/uL — ABNORMAL HIGH (ref 4.0–10.5)
nRBC: 0 % (ref 0.0–0.2)

## 2021-06-03 MED ORDER — AMMONIA AROMATIC IN INHA
RESPIRATORY_TRACT | Status: AC
  Filled 2021-06-03: qty 10

## 2021-06-03 MED ORDER — ACETAMINOPHEN 325 MG PO TABS: 650.0000 mg | ORAL_TABLET | ORAL | 1 refills | Status: AC | PRN

## 2021-06-03 MED ORDER — IBUPROFEN 600 MG PO TABS: 600.0000 mg | ORAL_TABLET | Freq: Four times a day (QID) | ORAL | 1 refills | Status: AC

## 2021-06-03 NOTE — Lactation Note (Addendum)
This note was copied from a baby's chart. Lactation Consultation Note  Patient Name: Kathryn Carr AJGOT'L Date: 06/03/2021 Reason for consult: Follow-up assessment;Mother's request;Early term 37-38.6wks Age:30 years  Mom stated infant not latching as well since circ. LC did some suck training, infant has labial and lingual attachment. He can extend his tongue pass the gum line. LC assisted Mom latching him in football sitting up, in this position he was able to sustain the latch and CAH sound noted. Mom stated total feeding was 22 minutes.  Mom denied any pain with latching, no nipple trauma or compression stripes noted.  Infant 4 urine and 3 stool today.   Plan 1. Feeding based on cues 8-12x in24 hr period, no more than 3 hrs without a feeding. Mom to offer both breasts with compression and look for swallows.            2. If infant unable to latch, Mom to do breast massage and hand expression, on offer EBM via spoon and then latch            3. I and O sheet reviewed.              4. Manual pump q 3hrs for 10 minutes               LC brochure of inpatient and outpatient services reviewed.  All questions answered at the end of the visit.   Maternal Data Has patient been taught Hand Expression?: Yes How long did the patient breastfeed?: drops pf colostrum noted  Feeding Mother's Current Feeding Choice: Breast Milk  LATCH Score Latch: Repeated attempts needed to sustain latch, nipple held in mouth throughout feeding, stimulation needed to elicit sucking reflex.  Audible Swallowing: A few with stimulation  Type of Nipple: Everted at rest and after stimulation  Comfort (Breast/Nipple): Soft / non-tender  Hold (Positioning): Assistance needed to correctly position infant at breast and maintain latch.  LATCH Score: 7   Lactation Tools Discussed/Used    Interventions Interventions: Breast feeding basics reviewed;Breast compression;Assisted with latch;Adjust position;Skin to  skin;Support pillows;Breast massage;Position options;Hand express;Expressed milk;Education;Pre-pump if needed  Discharge Discharge Education: Engorgement and breast care;Warning signs for feeding baby Pump: Manual;Personal WIC Program: No  Consult Status Consult Status: Complete Date: 06/04/21    Danner Paulding  Nicholson-Springer 06/03/2021, 3:38 PM

## 2021-06-03 NOTE — Discharge Summary (Signed)
Postpartum Discharge Summary   Patient Name: Kathryn Carr DOB: October 21, 1991 MRN: [id number]  Date of admission: 06/01/2021 Delivery date:06/02/2021  Delivering provider: Jerelyn Charles  Date of discharge: 06/03/2021  Admitting diagnosis: Gestational hypertension [O13.9] Intrauterine pregnancy: [redacted]w[redacted]d    Secondary diagnosis:  Active Problems:   Gestational hypertension  Additional problems: obesity    Discharge diagnosis: Term Pregnancy Delivered and Gestational Hypertension                                              Post partum procedures: none Augmentation: Pitocin and Cytotec Complications: None  Hospital course: Induction of Labor With Vaginal Delivery   30y.o. yo [id number] admitted to the hospital 06/01/2021 for induction of labor.  Indication for induction: Gestational hypertension.  Patient had an uncomplicated labor course as follows: Membrane Rupture Time/Date: 11:40 AM ,06/02/2021   Delivery Method:Vaginal, Spontaneous  Episiotomy: None  Lacerations:  None  Details of delivery can be found in separate delivery note.  For GHTN, patient did not require medication treatment during L&D course and postpartum BP were normotensive. Patient strongly desired discharge PPD#1 with plan for home BP monitoring and close office follow up. Patient otherwise had a routine postpartum course. Patient is discharged home 06/03/21.  Newborn Data: Birth date:06/02/2021  Birth time:2:32 PM  Gender:Female  Living status:Living  Apgars:8 ,9  Weight:2980 g   Magnesium Sulfate received: No BMZ received: No Rhophylac:N/A MMR:N/A T-DaP:Given prenatally Flu: completed Transfusion:No  Physical exam  Vitals:   06/03/21 0524 06/03/21 0937 06/03/21 1234 06/03/21 1533  BP: (!) 120/55 129/78 128/78 (!) 109/50  Pulse: 84 92 72 75  Resp: 18     Temp: 97.9 F (36.6 C)     TempSrc: Oral     SpO2: 99%     Weight:      Height:       General: alert Lochia: appropriate Uterine  Fundus: firm DVT Evaluation: No evidence of DVT seen on physical exam. Labs: Lab Results  Component Value Date   WBC 16.2 (H) 06/03/2021   HGB 11.3 (L) 06/03/2021   HCT 34.1 (L) 06/03/2021   MCV 91.4 06/03/2021   PLT 170 06/03/2021   CMP Latest Ref Rng & Units 06/01/2021  Glucose 70 - 99 mg/dL 75  BUN 6 - 20 mg/dL 5(L)  Creatinine 0.44 - 1.00 mg/dL 0.57  Sodium 135 - 145 mmol/L 135  Potassium 3.5 - 5.1 mmol/L 3.6  Chloride 98 - 111 mmol/L 103  CO2 22 - 32 mmol/L 24  Calcium 8.9 - 10.3 mg/dL 9.2  Total Protein 6.5 - 8.1 g/dL 6.2(L)  Total Bilirubin 0.3 - 1.2 mg/dL 0.4  Alkaline Phos 38 - 126 U/L 68  AST 15 - 41 U/L 16  ALT 0 - 44 U/L 11   Edinburgh Score: Edinburgh Postnatal Depression Scale Screening Tool 06/02/2021  I have been able to laugh and see the funny side of things. 0  I have looked forward with enjoyment to things. 0  I have blamed myself unnecessarily when things went wrong. 1  I have been anxious or worried for no good reason. 2  I have felt scared or panicky for no good reason. 1  Things have been getting on top of me. 0  I have been so unhappy that I have had difficulty sleeping. 0  I  have felt sad or miserable. 0  I have been so unhappy that I have been crying. 0  The thought of harming myself has occurred to me. 0  Edinburgh Postnatal Depression Scale Total 4      After visit meds:  Allergies as of 06/03/2021       Reactions   Penicillins Hives, Swelling   Has patient had a PCN reaction causing immediate rash, facial/tongue/throat swelling, SOB or lightheadedness with hypotension: Yes Has patient had a PCN reaction causing severe rash involving mucus membranes or skin necrosis: Yes Has patient had a PCN reaction that required hospitalization No Has patient had a PCN reaction occurring within the last 10 years: No If all of the above answers are "NO", then may proceed with Cephalosporin use.   Tape    Clear adhesive tape        Medication List      STOP taking these medications    aspirin EC 81 MG tablet       TAKE these medications    acetaminophen 325 MG tablet Commonly known as: Tylenol Take 2 tablets (650 mg total) by mouth every 4 (four) hours as needed (for pain scale < 4).   calcium carbonate 500 MG chewable tablet Commonly known as: TUMS - dosed in mg elemental calcium Chew 1-2 tablets by mouth 3 (three) times daily as needed for indigestion or heartburn.   ibuprofen 600 MG tablet Commonly known as: ADVIL Take 1 tablet (600 mg total) by mouth every 6 (six) hours.   prenatal multivitamin Tabs tablet Take 1 tablet by mouth daily at 12 noon.               Durable Medical Equipment  (From admission, onward)           Start     Ordered   06/03/21 0000  For home use only DME double electric breast pump        06/03/21 1642             Discharge home in stable condition Infant Feeding: Breast Infant Disposition:home with mother Discharge instruction: per After Visit Summary and Postpartum booklet. Activity: Advance as tolerated. Pelvic rest for 6 weeks.  Diet: routine diet Anticipated Birth Control: Nexplanon Postpartum Appointment:4 weeks Additional Postpartum F/U: BP check 4-5 days Future Appointments:No future appointments. Follow up Visit:  Follow-up Information     Taam-Akelman, Lawrence Santiago, MD Follow up today.   Specialty: Obstetrics and Gynecology Why: 4-5 days for a blood pressure check Contact information: [address] [place] [address] [phone]         Jerelyn Charles, MD Follow up in 4 week(s).   Specialty: Obstetrics Why: 4-6 weeks for a postpartum visit Contact information: [place] [address] [phone]                     06/03/2021 Jonelle Sidle, MD

## 2021-06-03 NOTE — Progress Notes (Signed)
Post Partum Day 1 Subjective: no complaints, up ad lib, voiding, and tolerating PO. Desires discharge today. Denies preeclampsia symptoms  Objective: Patient Vitals for the past 24 hrs:  BP Temp Temp src Pulse Resp SpO2  06/03/21 0524 (!) 120/55 97.9 F (36.6 C) Oral 84 18 99 %  06/03/21 0115 131/71 98.1 F (36.7 C) Oral 100 18 100 %  06/02/21 2111 120/64 98.6 F (37 C) Oral 91 18 99 %  06/02/21 1715 139/65 99.4 F (37.4 C) -- -- -- 99 %  06/02/21 1630 133/77 98.7 F (37.1 C) -- 72 16 100 %  06/02/21 1616 113/68 -- -- 73 16 --  06/02/21 1547 (!) 87/63 -- -- 82 18 --  06/02/21 1532 105/83 -- -- 90 16 --  06/02/21 1517 122/61 -- -- 81 16 --  06/02/21 1508 127/81 -- -- 83 18 --  06/02/21 1502 (!) 153/131 -- -- 93 -- --  06/02/21 1446 (!) 148/77 -- -- 78 -- --  06/02/21 1432 128/84 -- -- (!) 104 18 --  06/02/21 1402 103/65 -- -- 74 16 --  06/02/21 1339 (!) 131/112 -- -- 83 -- --  06/02/21 1305 (!) 133/30 -- -- (!) 108 -- --  06/02/21 1304 (!) 133/30 -- -- (!) 108 -- --  06/02/21 1233 133/71 -- -- 78 16 --  06/02/21 1202 122/63 -- -- 70 18 --  06/02/21 1132 (!) 139/55 -- -- 80 -- --  06/02/21 1117 (!) 147/76 98 F (36.7 C) Oral 79 18 --  06/02/21 1112 (!) 144/67 -- -- 95 16 --  06/02/21 1107 (!) 142/123 -- -- 91 -- --  06/02/21 1102 135/64 -- -- 77 16 --  06/02/21 1057 140/64 -- -- 73 18 --  06/02/21 1052 (!) 144/61 -- -- 76 18 --  06/02/21 1046 (!) 156/68 -- -- 84 16 --  06/02/21 1043 (!) 161/124 -- -- 77 18 --  06/02/21 0947 (!) 118/53 -- -- 70 18 --    Physical Exam:  General: alert Lochia: appropriate Uterine Fundus: firm DVT Evaluation: No evidence of DVT seen on physical exam.  Recent Labs    06/01/21 1505 06/02/21 0943 06/03/21 0539  WBC 9.1 13.7* 16.2*  HGB 12.4 13.1 11.3*  HCT 36.9 39.0 34.1*  PLT 166 183 170    Recent Labs    06/01/21 1505  NA 135  K 3.6  CL 103  BUN 5*  CREATININE 0.57  GLUCOSE 75  BILITOT 0.4  ALT 11  AST 16  ALKPHOS 68   PROT 6.2*  ALBUMIN 2.7*    Recent Labs    06/01/21 1505  CALCIUM 9.2    Assessment/Plan: Plan for discharge tomorrow vs. PM discharge today pending BP Maxene C Langstaff 30 y.o. [id number] PPD#1 sp SVD at [redacted]w[redacted]d for IOL 2/2 GHTN 1. PPC: no lacs, EBL 260cc, Hgb 13.1>11.3. Recommend PO iron. Rubella Immune, blood type O POS, breastfeeding, baby boy in room, birth control planning nexplanon, requests prior auth now to place at pp visit. Vaccines: completed tdap in pregnancy, flu, COVID series and booster 2. GHTN: monitor BP PP 3. Desires neonatal circumcision, R/B/A of procedure discussed at length. Pt understands that neonatal circumcision is not considered medically necessary and is elective. The risks include, but are not limited to bleeding, infection, damage to the penis, development of scar tissue, and having to have it redone at a later date. Pt understands theses risks and wishes to proceed     LOS: 2 days  Lasean Rahming K Taam-Akelman 06/03/2021, 9:34 AM

## 2021-06-03 NOTE — Anesthesia Postprocedure Evaluation (Signed)
Anesthesia Post Note  Patient: Kathryn Carr  Procedure(s) Performed: AN AD HOC LABOR EPIDURAL     Patient location during evaluation: Mother Baby Anesthesia Type: Epidural Level of consciousness: awake, oriented and awake and alert Pain management: pain level controlled Vital Signs Assessment: post-procedure vital signs reviewed and stable Respiratory status: spontaneous breathing, nonlabored ventilation and respiratory function stable Cardiovascular status: stable Postop Assessment: no headache, adequate PO intake, able to ambulate, no backache, patient able to bend at knees and no apparent nausea or vomiting Anesthetic complications: no   No notable events documented.  Last Vitals:  Vitals:   06/03/21 0115 06/03/21 0524  BP: 131/71 (!) 120/55  Pulse: 100 84  Resp: 18 18  Temp: 36.7 C 36.6 C  SpO2: 100% 99%    Last Pain:  Vitals:   06/03/21 0633  TempSrc:   PainSc: Asleep   Pain Goal:                   [name]

## 2021-06-13 ENCOUNTER — Telehealth (HOSPITAL_COMMUNITY): Payer: Self-pay | Admitting: *Deleted

## 2021-06-13 NOTE — Telephone Encounter (Signed)
Patient voiced no concerns for her own health at this time. EPDS score = 3. Regarding infant, patient reported, "I think his cord got pulled off by his diaper. There was a little bit of blood in the diaper." Patient reported that site has scabbed over. No signs of infection reported. Patient voiced no other concerns for infant at this time. Deforest Hoyles, RN, 06/13/21, [phone]

## 2021-06-30 ENCOUNTER — Encounter (HOSPITAL_COMMUNITY): Payer: Self-pay | Admitting: Obstetrics and Gynecology

## 2021-06-30 ENCOUNTER — Other Ambulatory Visit: Payer: Self-pay

## 2021-06-30 ENCOUNTER — Inpatient Hospital Stay (HOSPITAL_COMMUNITY)
Admission: AD | Admit: 2021-06-30 | Discharge: 2021-06-30 | Disposition: A | Discharge status: Home / Self Care | Payer: Managed Care, Other (non HMO) | Attending: Obstetrics and Gynecology | Admitting: Obstetrics and Gynecology

## 2021-06-30 DIAGNOSIS — R519 Headache, unspecified: Secondary | ICD-10-CM | POA: Insufficient documentation

## 2021-06-30 DIAGNOSIS — O9089 Other complications of the puerperium, not elsewhere classified: Secondary | ICD-10-CM | POA: Insufficient documentation

## 2021-06-30 DIAGNOSIS — Z880 Allergy status to penicillin: Secondary | ICD-10-CM | POA: Insufficient documentation

## 2021-06-30 DIAGNOSIS — O99893 Other specified diseases and conditions complicating puerperium: Secondary | ICD-10-CM | POA: Diagnosis not present

## 2021-06-30 DIAGNOSIS — Z87891 Personal history of nicotine dependence: Secondary | ICD-10-CM | POA: Insufficient documentation

## 2021-06-30 DIAGNOSIS — R42 Dizziness and giddiness: Secondary | ICD-10-CM | POA: Diagnosis not present

## 2021-06-30 LAB — COMPREHENSIVE METABOLIC PANEL
ALT: 21 U/L (ref 0–44)
AST: 18 U/L (ref 15–41)
Albumin: 3.6 g/dL (ref 3.5–5.0)
Alkaline Phosphatase: 59 U/L (ref 38–126)
Anion gap: 5 (ref 5–15)
BUN: 11 mg/dL (ref 6–20)
CO2: 28 mmol/L (ref 22–32)
Calcium: 9 mg/dL (ref 8.9–10.3)
Chloride: 106 mmol/L (ref 98–111)
Creatinine, Ser: 0.85 mg/dL (ref 0.44–1.00)
GFR, Estimated: >60 mL/min (ref >60)
Glucose, Bld: 88 mg/dL (ref 70–99)
Potassium: 3.7 mmol/L (ref 3.5–5.1)
Sodium: 139 mmol/L (ref 135–145)
Total Bilirubin: 0.3 mg/dL (ref 0.3–1.2)
Total Protein: 6.5 g/dL (ref 6.5–8.1)

## 2021-06-30 LAB — URINALYSIS, ROUTINE W REFLEX MICROSCOPIC
Bilirubin Urine: NEGATIVE
Glucose, UA: NEGATIVE mg/dL
Ketones, ur: NEGATIVE mg/dL
Leukocytes,Ua: NEGATIVE
Nitrite: NEGATIVE
Protein, ur: NEGATIVE mg/dL
Specific Gravity, Urine: 1.006 (ref 1.005–1.030)
pH: 6 (ref 5.0–8.0)

## 2021-06-30 LAB — CBC
HCT: 37.7 % (ref 36.0–46.0)
Hemoglobin: 12.7 g/dL (ref 12.0–15.0)
MCH: 30.1 pg (ref 26.0–34.0)
MCHC: 33.7 g/dL (ref 30.0–36.0)
MCV: 89.3 fL (ref 80.0–100.0)
Platelets: 245 10*3/uL (ref 150–400)
RBC: 4.22 MIL/uL (ref 3.87–5.11)
RDW: 13 % (ref 11.5–15.5)
WBC: 6.8 10*3/uL (ref 4.0–10.5)
nRBC: 0 % (ref 0.0–0.2)

## 2021-06-30 LAB — PROTEIN / CREATININE RATIO, URINE
Creatinine, Urine: 45.91 mg/dL
Total Protein, Urine: <6 mg/dL

## 2021-06-30 LAB — PREGNANCY, URINE: Preg Test, Ur: NEGATIVE

## 2021-06-30 MED ORDER — ACETAMINOPHEN 500 MG PO TABS
1000.0000 mg | ORAL_TABLET | Freq: Four times a day (QID) | ORAL | Status: DC | PRN
  Administered 2021-06-30: 1000 mg via ORAL
  Filled 2021-06-30: qty 2

## 2021-06-30 NOTE — MAU Provider Note (Addendum)
History     CSN: [id number]  Arrival date and time: 06/30/21 1752   Event Date/Time   First Provider Initiated Contact with Patient 06/30/21 1844      Chief Complaint  Patient presents with   Dizziness   Headache   30 y.o. G2P2 s/p SVD 1 month ago presenting with 1 week of dizziness and floater in right eye. Dizziness is intermittent. Denies syncope. The floater is "black squiggly line", sees mostly on white backgrounds. Also has occasional HA's. Rates pain 5/10. Denies RUQ pain, SOB, and CP. States her sx became more noticeable after she went to the beach. She admits to inadequate water intake. She is breastfeeding. Her pregnancy was complicated by gHTN. She is not taking meds. Also reports intermittent LBP.     OB History     Gravida  2   Para  2   Term  2   Preterm      AB      Living  2      SAB      IAB      Ectopic      Multiple  0   Live Births  2           Past Medical History:  Diagnosis Date   Complication of anesthesia    Father wakes up slowly    Past Surgical History:  Procedure Laterality Date   WISDOM TOOTH EXTRACTION      Family History  Problem Relation Age of Onset   Cancer Mother    Diabetes Mother    Heart disease Father    Diabetes Father    Diabetes Brother    Diabetes Son    Heart disease Maternal Grandfather    Diabetes Maternal Grandfather    Diabetes Paternal Grandmother    Kidney disease Paternal Grandmother    Heart disease Paternal Grandmother    Stroke Paternal Grandfather     Social History   Tobacco Use   Smoking status: Former    Types: Cigarettes    Quit date: 03/20/2014    Years since quitting: 7.2   Smokeless tobacco: Never  Vaping Use   Vaping Use: Never used  Substance Use Topics   Alcohol use: No    Alcohol/week: 0.0 standard drinks   Drug use: No    Allergies:  Allergies  Allergen Reactions   Penicillins Hives and Swelling    Has patient had a PCN reaction causing immediate rash,  facial/tongue/throat swelling, SOB or lightheadedness with hypotension: Yes Has patient had a PCN reaction causing severe rash involving mucus membranes or skin necrosis: Yes Has patient had a PCN reaction that required hospitalization No Has patient had a PCN reaction occurring within the last 10 years: No If all of the above answers are "NO", then may proceed with Cephalosporin use.    Tape     Clear adhesive tape     Medications Prior to Admission  Medication Sig Dispense Refill Last Dose   acetaminophen (TYLENOL) 325 MG tablet Take 2 tablets (650 mg total) by mouth every 4 (four) hours as needed (for pain scale < 4). 30 tablet 1 06/29/2021 at 1300   ibuprofen (ADVIL) 600 MG tablet Take 1 tablet (600 mg total) by mouth every 6 (six) hours. 30 tablet 1 Past Month   Prenatal Vit-Fe Fumarate-FA (PRENATAL MULTIVITAMIN) TABS tablet Take 1 tablet by mouth daily at 12 noon.   06/29/2021   calcium carbonate (TUMS - DOSED IN MG ELEMENTAL CALCIUM) 500  MG chewable tablet Chew 1-2 tablets by mouth 3 (three) times daily as needed for indigestion or heartburn.       Review of Systems  Eyes:  Positive for visual disturbance.  Respiratory:  Negative for shortness of breath.   Cardiovascular:  Negative for chest pain.  Musculoskeletal:  Positive for back pain.  Neurological:  Positive for dizziness and headaches. Negative for syncope.  Physical Exam   Blood pressure (!) 113/59, pulse 64, temperature 98 F (36.7 C), temperature source Oral, resp. rate 16, height 5\' 2"  (1.575 m), weight 122.1 kg, SpO2 100 %, currently breastfeeding. Patient Vitals for the past 24 hrs:  BP Temp Temp src Pulse Resp SpO2 Height Weight  06/30/21 1904 (!) 110/47 -- -- 69 -- 98 % -- --  06/30/21 1818 (!) 113/59 98 F (36.7 C) Oral 64 16 100 % -- --  06/30/21 1814 -- -- -- -- -- -- 5\' 2"  (1.575 m) 122.1 kg   Physical Exam Vitals and nursing note reviewed.  Constitutional:      General: She is not in acute distress.     Appearance: Normal appearance.  HENT:     Head: Normocephalic and atraumatic.  Eyes:     Extraocular Movements: Extraocular movements intact.     Pupils: Pupils are equal, round, and reactive to light.  Cardiovascular:     Rate and Rhythm: Normal rate.  Pulmonary:     Effort: Pulmonary effort is normal. No respiratory distress.  Musculoskeletal:        General: Normal range of motion.     Cervical back: Normal range of motion.  Skin:    General: Skin is warm and dry.  Neurological:     General: No focal deficit present.     Mental Status: She is alert and oriented to person, place, and time.     Cranial Nerves: No cranial nerve deficit.     Sensory: No sensory deficit.     Motor: No weakness.  Psychiatric:        Mood and Affect: Mood normal.        Behavior: Behavior normal.   No results found for this or any previous visit (from the past 24 hour(s)).  MAU Course  Procedures Tylenol  MDM Labs ordered.  Transfer of care given to [facility], CNM , [facility]  06/30/2021 8:06 PM   Results for orders placed or performed during the hospital encounter of 06/30/21 (from the past 24 hour(s))  Protein / creatinine ratio, urine     Status: None   Collection Time: 06/30/21  7:00 PM  Result Value Ref Range   Creatinine, Urine 45.91 mg/dL   Total Protein, Urine <6 mg/dL   Protein Creatinine Ratio NOT CALCULATED 0.00 - 0.15 mg/mg[Cre]  Urinalysis, Routine w reflex microscopic Urine, Clean Catch     Status: Abnormal   Collection Time: 06/30/21  7:00 PM  Result Value Ref Range   Color, Urine STRAW (A) YELLOW   APPearance CLEAR CLEAR   Specific Gravity, Urine 1.006 1.005 - 1.030   pH 6.0 5.0 - 8.0   Glucose, UA NEGATIVE NEGATIVE mg/dL   Hgb urine dipstick MODERATE (A) NEGATIVE   Bilirubin Urine NEGATIVE NEGATIVE   Ketones, ur NEGATIVE NEGATIVE mg/dL   Protein, ur NEGATIVE NEGATIVE mg/dL   Nitrite NEGATIVE NEGATIVE   Leukocytes,Ua NEGATIVE NEGATIVE   RBC /  HPF 0-5 0 - 5 RBC/hpf   WBC, UA 0-5 0 - 5 WBC/hpf   Bacteria, UA RARE (  A) NONE SEEN   Squamous Epithelial / LPF 0-5 0 - 5   Mucus PRESENT   Pregnancy, urine     Status: None   Collection Time: 06/30/21  7:00 PM  Result Value Ref Range   Preg Test, Ur NEGATIVE NEGATIVE  CBC     Status: None   Collection Time: 06/30/21  7:14 PM  Result Value Ref Range   WBC 6.8 4.0 - 10.5 K/uL   RBC 4.22 3.87 - 5.11 MIL/uL   Hemoglobin 12.7 12.0 - 15.0 g/dL   HCT 15.4 00.8 - 67.6 %   MCV 89.3 80.0 - 100.0 fL   MCH 30.1 26.0 - 34.0 pg   MCHC 33.7 30.0 - 36.0 g/dL   RDW 19.5 09.3 - 26.7 %   Platelets 245 150 - 400 K/uL   nRBC 0.0 0.0 - 0.2 %  Comprehensive metabolic panel     Status: None   Collection Time: 06/30/21  7:14 PM  Result Value Ref Range   Sodium 139 135 - 145 mmol/L   Potassium 3.7 3.5 - 5.1 mmol/L   Chloride 106 98 - 111 mmol/L   CO2 28 22 - 32 mmol/L   Glucose, Bld 88 70 - 99 mg/dL   BUN 11 6 - 20 mg/dL   Creatinine, Ser 1.24 0.44 - 1.00 mg/dL   Calcium 9.0 8.9 - 58.0 mg/dL   Total Protein 6.5 6.5 - 8.1 g/dL   Albumin 3.6 3.5 - 5.0 g/dL   AST 18 15 - 41 U/L   ALT 21 0 - 44 U/L   Alkaline Phosphatase 59 38 - 126 U/L   Total Bilirubin 0.3 0.3 - 1.2 mg/dL   GFR, Estimated >99 >83 mL/min   Anion gap 5 5 - 15    MDM:  CBC and CMP unremarkable.  BP all normal to low normal.  No evidence of preeclampsia.  Neuro exam wnl.  No acute findings. H/A down from 5/10 to 2/10 after Tylenol. Pt continues to report black floater in right eye, unchanged in 2 weeks.  Pt to f/u with optometrist as soon as possible.  Pt to increase PO fluids.  Reviewed reasons to seek emergency care.   Assessment and Plan   1. Acute nonintractable headache, unspecified headache type   2. Dizziness   3. Postpartum complication      D/C home  Sharen Counter, CNM 9:25 PM

## 2021-06-30 NOTE — MAU Note (Signed)
Kathryn Carr is a 30 y.o. here in MAU reporting: SVD on 06/02/2021, pregnancy complicated by [facility]. Has been having dizziness, headaches, and a floaters in her right eyes. Has had these symptoms for a few weeks.  Onset of complaint: ongoing  Pain score: 5/10  Vitals:   06/30/21 1818  BP: (!) 113/59  Pulse: 64  Resp: 16  Temp: 98 F (36.7 C)  SpO2: 100%     Lab orders placed from triage: none

## 2021-07-07 IMAGING — US US MFM OB FOLLOW-UP
1 series · 13 of 28 positions shown · non-contrast
Comparison: none

[Series 1: us mfm ob follow-up · 13 of 29 slices shown]
[im 2/29]
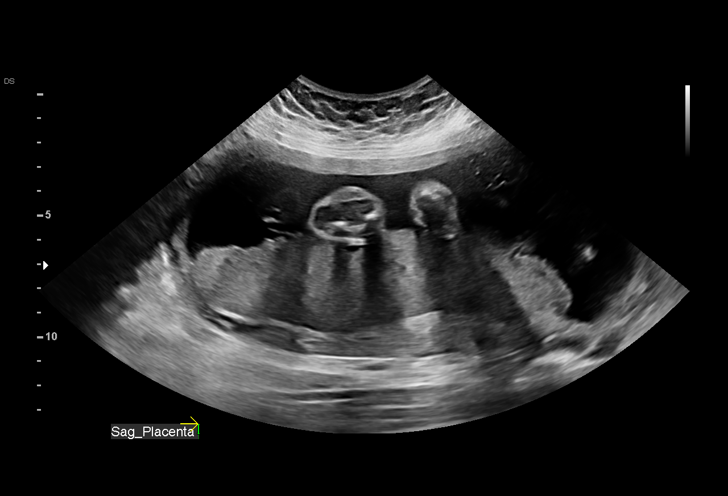
[im 4/29]
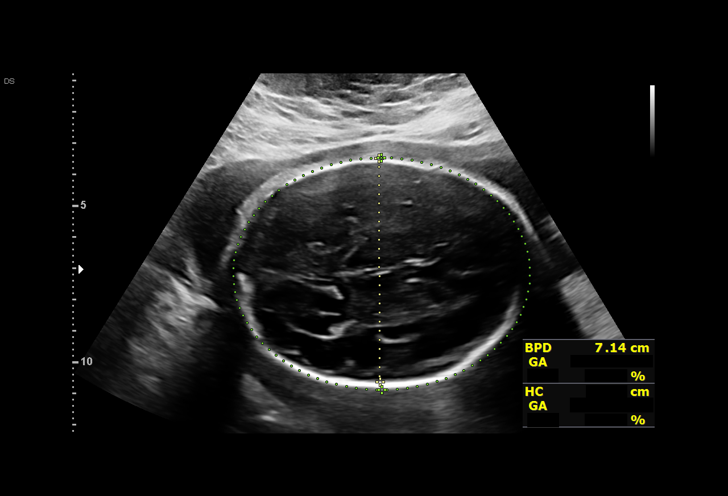
[im 6/29]
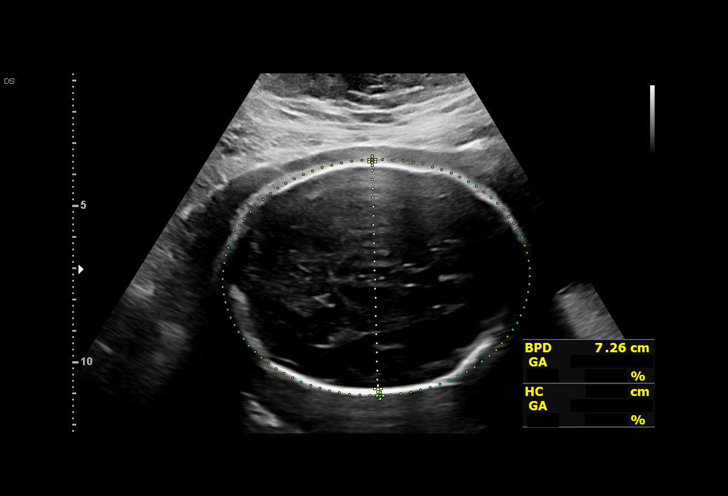
[im 8/29]
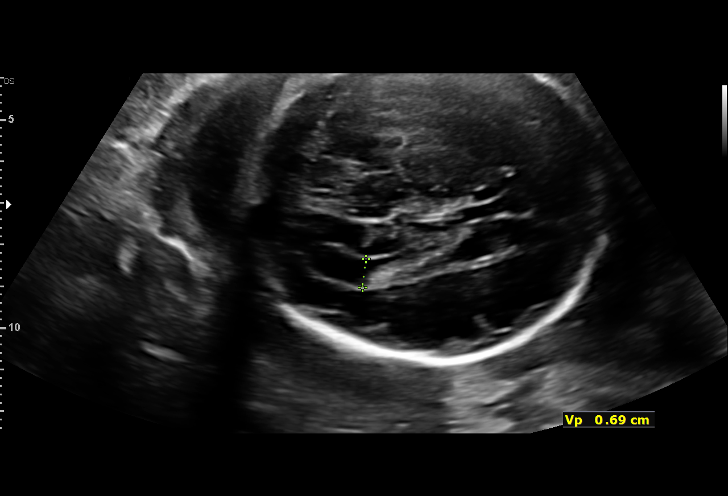
[im 10/29]
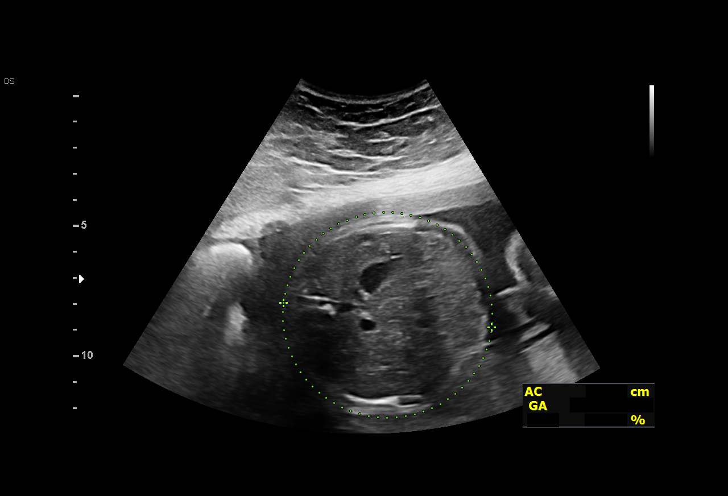
[im 12/29]
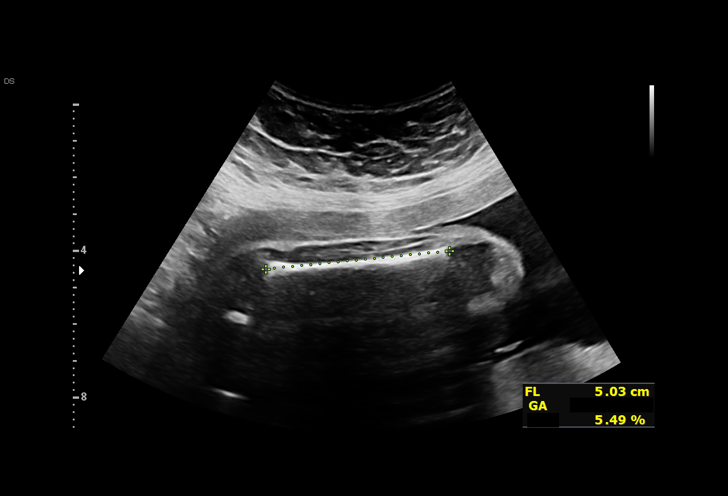
[im 15/29]
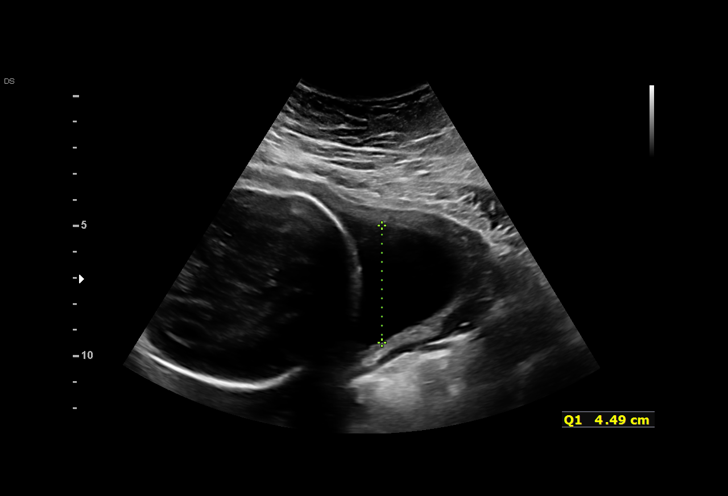
[im 17/29]
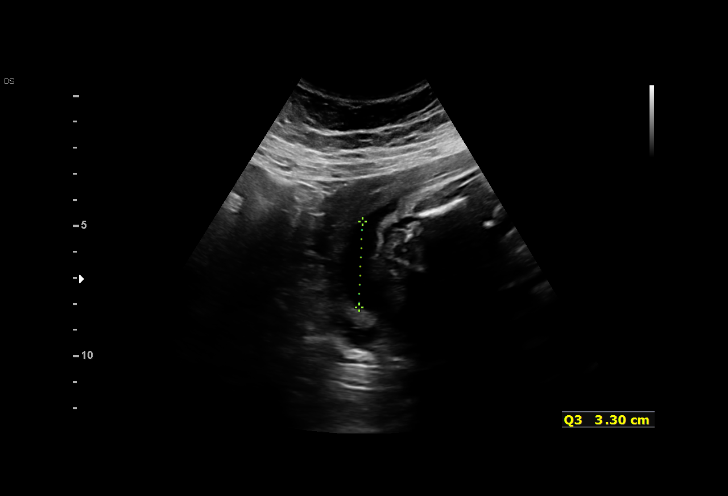
[im 19/29]
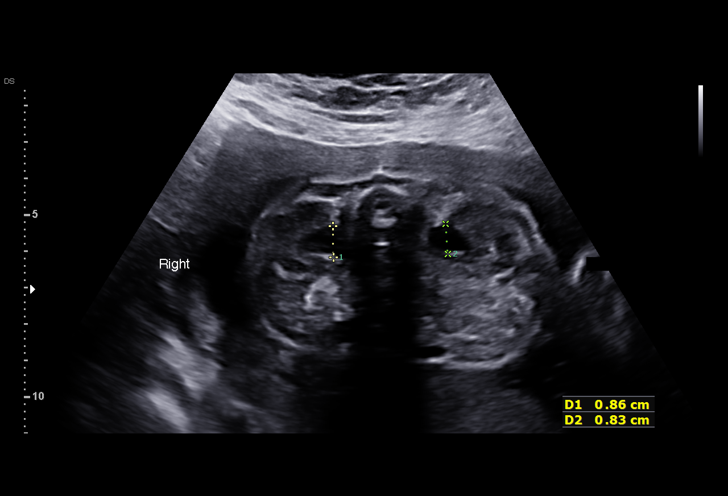
[im 21/29]
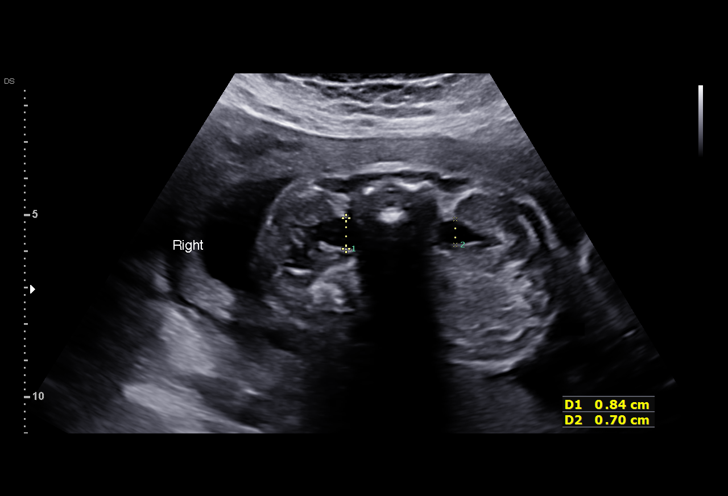
[im 23/29]
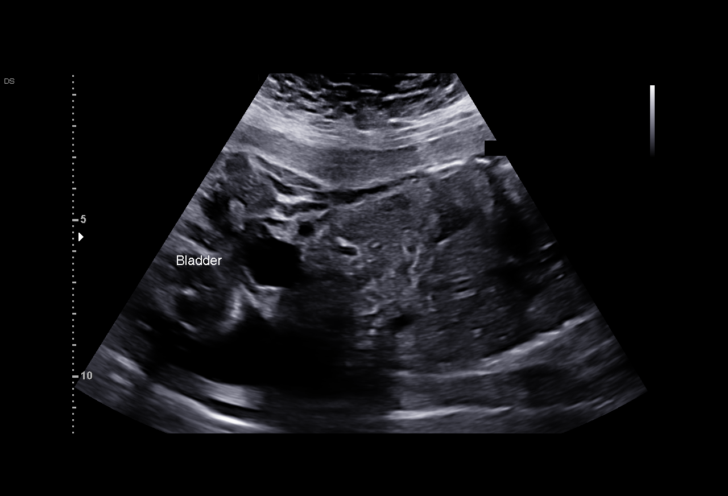
[im 25/29]
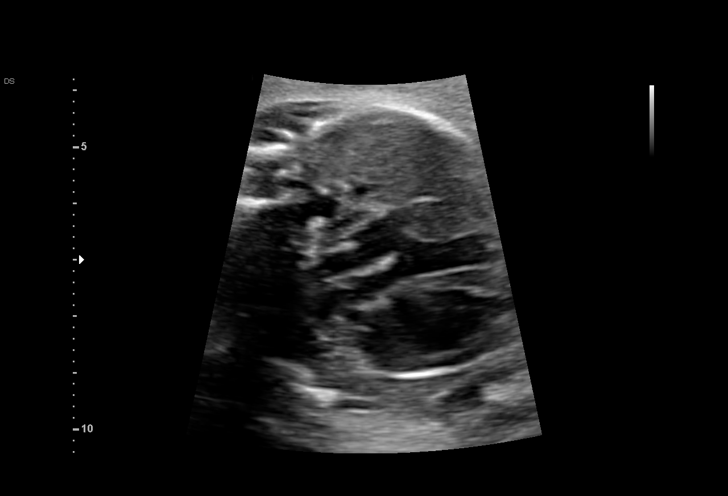
[im 27/29]
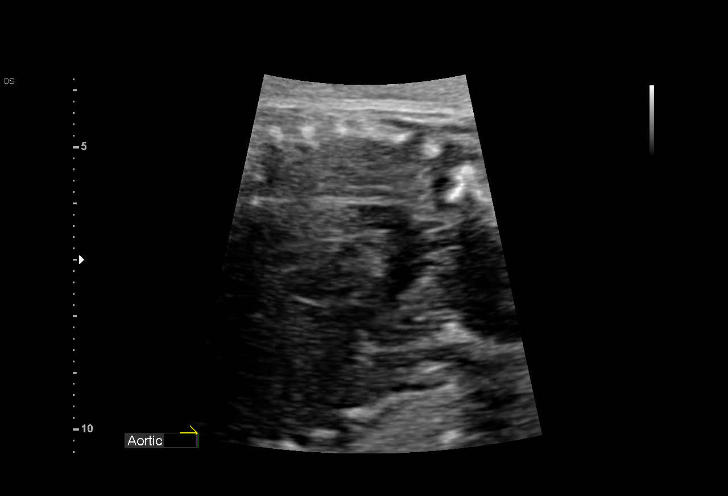

[13 of 28 positions shown; findings below may reference images not displayed]

MIDORI DO

Indications

 Obesity complicating pregnancy, second
 trimester (BMI:47.2)
 Encounter for antenatal screening for
 malformations
 28 weeks gestation of pregnancy
 NIPS: Low risk , M AFP:Neg
 Pyelectasis of fetus on prenatal ultrasound
Fetal Evaluation

 Num Of Fetuses:         1
 Cardiac Activity:       Observed
 Presentation:           Cephalic
 Placenta:               Posterior
 P. Cord Insertion:      Visualized, central

 Amniotic Fluid
 AFI FV:      Within normal limits

 AFI Sum(cm)     %Tile       Largest Pocket(cm)
 17.63           66

 RUQ(cm)       RLQ(cm)       LUQ(cm)        LLQ(cm)

Biometry
 BPD:      71.8  mm     G. Age:  28w 6d         46  %    CI:           71   %    70 - 86
                                                         FL/HC:      18.6   %    19.6 -
 HC:      271.5  mm     G. Age:  29w 4d         50  %    HC/AC:      1.11        0.99 -
 AC:      244.9  mm     G. Age:  28w 5d         48  %    FL/BPD:     70.3   %    71 - 87
 FL:       50.5  mm     G. Age:  27w 1d          6  %    FL/AC:      20.6   %    20 - 24
 LV:        6.9  mm

 Est. FW:    8438  gm    2 lb 10 oz      27  %
OB History

 Gravidity:    2         Term:   1
 Living:       1
Gestational Age

 LMP:           28w 4d        Date:  09/12/20                 EDD:   06/19/21
 U/S Today:     28w 4d                                        EDD:   06/19/21
 Best:          28w 4d     Det. By:  LMP  (09/12/20)          EDD:   06/19/21
Anatomy

 Cranium:               Appears normal         Aortic Arch:            Appears normal
 Cavum:                 Appears normal         Ductal Arch:            Previously seen
 Ventricles:            Appears normal         Diaphragm:              Previously seen
 Choroid Plexus:        Appears normal         Stomach:                Appears normal, left
                                                                       sided
 Cerebellum:            Appears normal         Abdomen:                Previously seen
 Posterior Fossa:       Appears normal         Abdominal Wall:         Previously seen
 Nuchal Fold:           Not applicable (>20    Cord Vessels:           Previously seen
                        wks GA)
 Face:                  Appears normal         Kidneys:                Bilateral UTD
                        (orbits and profile)
 Lips:                  Appears normal         Bladder:                Appears normal
 Thoracic:              Previously seen        Spine:                  Previously seen
 Heart:                 Appears normal         Upper Extremities:      Previously seen
                        (4CH, axis, and
                        situs)
 RVOT:                  Appears normal         Lower Extremities:      Previously seen
 LVOT:                  Appears normal

 Other:  Male gender previously seen. Rt. renal pelvis measures 8.6mm and
         Left renal pelvis measures 8.3mm (largest dimensions).
Cervix Uterus Adnexa

 Cervix
 Length:           6.06  cm.
 Normal appearance by transabdominal scan.
Impression

 Patient returned for fetal growth and renal assessments.  On
 fetal anatomy scan bilateral urinary tract dilations (UTD) were
 seen.  She reports she had abnormal glucose challenge test
 and will be undergoing 3-hour GTT.
 Fetal growth is appropriate for gestational age.  Amniotic fluid
 is normal and good fetal activity seen.  Bilateral UTD or seen
 with the right pelvis measuring 9 mm and left measuring 8
 mm.  Both kidneys, otherwise, appear normal with no hyper
 echogenicities.
 I reassured the patient of stable findings.
Recommendations

 -An appointment was made for her to return in 4 weeks for
 fetal growth assessment (BMI>40).
                 Khaling, M Ali

## 2022-03-17 ENCOUNTER — Ambulatory Visit (HOSPITAL_COMMUNITY)
Admission: EM | Admit: 2022-03-17 | Discharge: 2022-03-17 | Disposition: A | Discharge status: Home / Self Care | Payer: BC Managed Care – PPO | Attending: Internal Medicine | Admitting: Internal Medicine

## 2022-03-17 ENCOUNTER — Encounter (HOSPITAL_COMMUNITY): Payer: Self-pay

## 2022-03-17 DIAGNOSIS — J020 Streptococcal pharyngitis: Secondary | ICD-10-CM

## 2022-03-17 LAB — POCT RAPID STREP A, ED / UC: Streptococcus, Group A Screen (Direct): POSITIVE — AB

## 2022-03-17 MED ORDER — AZITHROMYCIN 500 MG PO TABS: 500.0000 mg | ORAL_TABLET | Freq: Every day | ORAL | 0 refills | Status: AC

## 2022-03-17 NOTE — ED Provider Notes (Signed)
?[facility] ? ? ? ?CSN: [id number] ?Arrival date & time: 03/17/22  1910 ? ? ?  ? ?History   ?Chief Complaint ?Chief Complaint  ?Patient presents with  ? Sore Throat  ? ? ?HPI ?Kathryn Carr is a 31 y.o. female who presents with a ST x 2 weeks. Started with what she thought it was allergies, but that got better. Still has a mild cough, but is also getting better. Has been hot, but has not checked her temp.  ? ? ? ?Past Medical History:  ?Diagnosis Date  ? Complication of anesthesia   ? Father wakes up slowly  ? ? ?Patient Active Problem List  ? Diagnosis Date Noted  ? Spontaneous vaginal delivery 09/04/2015  ? Gestational hypertension 09/03/2015  ? 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 [redacted] weeks gestation of pregnancy   ? Encounter for fetal anatomic survey   ? Obesity affecting pregnancy in second trimester, antepartum   ? [redacted] weeks gestation of pregnancy   ? Encounter for (NT) nuchal translucency scan   ? Obesity, morbid (HCC) 12/21/2014  ? Insulin resistance 12/21/2014  ? Hyperinsulinemia 12/21/2014  ? Acanthosis nigricans, acquired 12/21/2014  ? Dyspepsia 12/21/2014  ? Goiter 12/21/2014  ? 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 ? ?Past Surgical History:  ?Procedure Laterality Date  ? WISDOM TOOTH EXTRACTION    ? ? ?OB History   ? ? Gravida  ?2  ? Para  ?2  ? Term  ?2  ? Preterm  ?   ? AB  ?   ? Living  ?2  ?  ? ? SAB  ?   ? IAB  ?   ? Ectopic  ?   ? Multiple  ?0  ? Live Births  ?2  ?   ?  ?  ? ? ? ?Home Medications   ? ?Prior to Admission medications   ?Medication Sig Start Date End Date Taking? Authorizing Provider  ?azithromycin (ZITHROMAX) 500 MG tablet Take 1 tablet (500 mg total) by mouth daily. 03/17/22  Yes Rodriguez-Southworth, Nettie ElmSylvia, PA-C  ?acetaminophen (TYLENOL) 325 MG tablet Take 2 tablets (650 mg total) by mouth every 4 (four) hours as needed (for pain scale < 4). 06/03/21   Taam-Akelman, Griselda Minerosalea K, MD  ?ibuprofen (ADVIL) 600 MG tablet Take 1 tablet (600 mg total) by mouth every 6  (six) hours. 06/03/21   Rande Bruntaam-Akelman, Rosalea K, MD  ? ? ?Family History ?Family History  ?Problem Relation Age of Onset  ? Cancer Mother   ? Diabetes Mother   ? Heart disease Father   ? Diabetes Father   ? Diabetes Brother   ? Diabetes Son   ? Heart disease Maternal Grandfather   ? Diabetes Maternal Grandfather   ? Diabetes Paternal Grandmother   ? Kidney disease Paternal Grandmother   ? Heart disease Paternal Grandmother   ? Stroke Paternal Grandfather   ? ? ?Social History ?Social History  ? ?Tobacco Use  ? Smoking status: Former  ?  Types: Cigarettes  ?  Quit date: 03/20/2014  ?  Years since quitting: 7.9  ? Smokeless tobacco: Never  ?Vaping Use  ? Vaping Use: Never used  ?Substance Use Topics  ? Alcohol use: No  ?  Alcohol/week: 0.0 standard drinks  ? Drug use: No  ? ? ? ?Allergies   ?Penicillins and Tape ? ? ?Review of Systems ?Review of Systems  ?Constitutional:  Positive for chills. Negative for activity change, appetite change and fever.  ?  HENT:  Positive for sore throat. Negative for congestion, ear discharge, ear pain, postnasal drip and rhinorrhea.   ?Eyes:  Negative for discharge.  ?Respiratory:  Positive for cough.   ?Musculoskeletal:  Negative for myalgias.  ?Skin:  Negative for rash.  ?Neurological:  Negative for headaches.  ? ? ?Physical Exam ?Triage Vital Signs ?ED Triage Vitals  ?Enc Vitals Group  ?   BP 03/17/22 1947 117/70  ?   Pulse Rate 03/17/22 1947 84  ?   Resp 03/17/22 1945 16  ?   Temp 03/17/22 1947 98.9 ?F (37.2 ?C)  ?   Temp Source 03/17/22 1947 Oral  ?   SpO2 03/17/22 1947 98 %  ?   Weight --   ?   Height --   ?   Head Circumference --   ?   Peak Flow --   ?   Pain Score 03/17/22 1945 5  ?   Pain Loc --   ?   Pain Edu? --   ?   Excl. in GC? --   ? ?No data found. ? ?Updated Vital Signs ?BP 117/70 (BP Location: Left Arm)   Pulse 84   Temp 98.9 ?F (37.2 ?C) (Oral)   Resp 16   SpO2 98%  ? ?Visual Acuity ?Right Eye Distance:   ?Left Eye Distance:   ?Bilateral Distance:   ? ?Right Eye  Near:   ?Left Eye Near:    ?Bilateral Near:    ? ?Physical Exam ?Physical Exam ?Vitals signs and nursing note reviewed.  ?Constitutional:   ?   General: She is not in acute distress. ?   Appearance: Normal appearance. She is not ill-appearing, toxic-appearing or diaphoretic.  ?HENT:  ?   Head: Normocephalic.  ?   Right Ear: Tympanic membrane, ear canal and external ear normal.  ?   Left Ear: Tympanic membrane, ear canal and external ear normal.  ?   Nose: Nose normal.  ?   Mouth/Throat: erythematous ?   Mouth: Mucous membranes are moist.  ?Eyes:  ?   General: No scleral icterus.    ?   Right eye: No discharge.     ?   Left eye: No discharge.  ?   Conjunctiva/sclera: Conjunctivae normal.  ?Neck:  ?   Musculoskeletal: Neck supple. No neck rigidity.  ?Cardiovascular:  ?   Rate and Rhythm: Normal rate and regular rhythm.  ?   Heart sounds: No murmur.  ?Pulmonary:  ?   Effort: Pulmonary effort is normal.  ?   Breath sounds: Normal breath sounds.  ?Musculoskeletal: Normal range of motion.  ?Lymphadenopathy:  ?   Cervical: No cervical adenopathy.  ?Skin: ?   General: Skin is warm and dry.  ?   Coloration: Skin is not jaundiced.  ?   Findings: No rash.  ?Neurological:  ?   Mental Status: She is alert and oriented to person, place, and time.  ?   Gait: Gait normal.  ?Psychiatric:     ?   Mood and Affect: Mood normal.     ?   Behavior: Behavior normal.     ?   Thought Content: Thought content normal.     ?   Judgment: Judgment normal.  ? ? ?UC Treatments / Results  ?Labs ?(all labs ordered are listed, but only abnormal results are displayed) ?Labs Reviewed  ?POCT RAPID STREP A, ED / UC - Abnormal; Notable for the following components:  ?    Result Value  ?  Streptococcus, Group A Screen (Direct) POSITIVE (*)   ? All other components within normal limits  ? ? ?EKG ? ? ?Radiology ?No results found. ? ?Procedures ?Procedures (including critical care time) ? ?Medications Ordered in UC ?Medications - No data to display ? ?Initial  Impression / Assessment and Plan / UC Course  ?I have reviewed the triage vital signs and the nursing notes. ? ?Pertinent labs results that were available during my care of the patient were reviewed by me and considered in my medical decision making (see chart for details). ? ?Has strep throat ?I placed her on Azithromycin as noted.  ? ? ? ?Final Clinical Impressions(s) / UC Diagnoses  ? ?Final diagnoses:  ?Streptococcal sore throat  ? ?Discharge Instructions   ?None ?  ? ?ED Prescriptions   ? ? Medication Sig Dispense Auth. Provider  ? azithromycin (ZITHROMAX) 500 MG tablet Take 1 tablet (500 mg total) by mouth daily. 5 tablet Rodriguez-Southworth, Nettie Elm, PA-C  ? ?  ? ?PDMP not reviewed this encounter. ?  ?Garey Ham, PA-C ?03/17/22 2034 ? ?

## 2022-03-17 NOTE — ED Triage Notes (Signed)
Pt presents with sore throat x 2 weeks ?

## 2024-07-02 DIAGNOSIS — Z3046 Encounter for surveillance of implantable subdermal contraceptive: Secondary | ICD-10-CM | POA: Diagnosis not present

## 2024-08-11 ENCOUNTER — Other Ambulatory Visit (HOSPITAL_COMMUNITY): Payer: Self-pay

## 2024-08-11 MED ORDER — ESCITALOPRAM OXALATE 10 MG PO TABS
10.0000 mg | ORAL_TABLET | Freq: Every day | ORAL | 3 refills | Status: AC
  Filled 2024-08-11: qty 90, 90d supply, fill #0
  Filled 2024-11-11: qty 90, 90d supply, fill #1

## 2024-09-06 ENCOUNTER — Other Ambulatory Visit (HOSPITAL_COMMUNITY): Payer: Self-pay

## 2024-10-01 ENCOUNTER — Other Ambulatory Visit (HOSPITAL_COMMUNITY): Payer: Self-pay

## 2024-10-01 MED ORDER — FLUZONE 0.5 ML IM SUSY
0.5000 mL | PREFILLED_SYRINGE | Freq: Once | INTRAMUSCULAR | 0 refills | Status: AC
  Filled 2024-10-01: qty 0.5, 1d supply, fill #0
